# Patient Record
Sex: Female | Born: 1985
Health system: Southern US, Community
[De-identification: ages and names within clinical notes are randomized; demographics above are authoritative.]

## PROBLEM LIST (undated history)

## (undated) DIAGNOSIS — E042 Nontoxic multinodular goiter: Secondary | ICD-10-CM

## (undated) DIAGNOSIS — R079 Chest pain, unspecified: Secondary | ICD-10-CM

## (undated) DIAGNOSIS — I2699 Other pulmonary embolism without acute cor pulmonale: Secondary | ICD-10-CM

## (undated) DIAGNOSIS — K219 Gastro-esophageal reflux disease without esophagitis: Secondary | ICD-10-CM

## (undated) HISTORY — DX: Gastro-esophageal reflux disease without esophagitis: K21.9

## (undated) HISTORY — DX: Other pulmonary embolism without acute cor pulmonale: I26.99

## (undated) HISTORY — DX: Nontoxic multinodular goiter: E04.2

## (undated) HISTORY — PX: WISDOM TOOTH EXTRACTION: SHX21

## (undated) HISTORY — DX: Chest pain, unspecified: R07.9

---
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
## 2007-03-01 DIAGNOSIS — I2699 Other pulmonary embolism without acute cor pulmonale: Secondary | ICD-10-CM

## 2007-03-01 HISTORY — DX: Other pulmonary embolism without acute cor pulmonale: I26.99

## 2007-12-19 ENCOUNTER — Other Ambulatory Visit: Admission: RE | Admit: 2007-12-19 | Discharge: 2007-12-19 | Payer: Self-pay | Admitting: Obstetrics and Gynecology

## 2008-01-01 ENCOUNTER — Encounter: Admission: RE | Admit: 2008-01-01 | Discharge: 2008-01-01 | Payer: Self-pay | Admitting: Obstetrics and Gynecology

## 2008-01-15 ENCOUNTER — Encounter (INDEPENDENT_AMBULATORY_CARE_PROVIDER_SITE_OTHER): Payer: Self-pay | Admitting: Interventional Radiology

## 2008-01-15 ENCOUNTER — Other Ambulatory Visit: Admission: RE | Admit: 2008-01-15 | Discharge: 2008-01-15 | Payer: Self-pay | Admitting: Interventional Radiology

## 2008-01-15 ENCOUNTER — Encounter: Admission: RE | Admit: 2008-01-15 | Discharge: 2008-01-15 | Payer: Self-pay | Admitting: Internal Medicine

## 2008-01-16 ENCOUNTER — Inpatient Hospital Stay (HOSPITAL_COMMUNITY): Admission: EM | Admit: 2008-01-16 | Discharge: 2008-01-18 | Payer: Self-pay | Admitting: Emergency Medicine

## 2008-04-30 ENCOUNTER — Ambulatory Visit: Payer: Self-pay | Admitting: Oncology

## 2008-05-15 LAB — CBC WITH DIFFERENTIAL/PLATELET
BASO%: 0.2 % (ref 0.0–2.0)
Basophils Absolute: 0 10*3/uL (ref 0.0–0.1)
EOS%: 1 % (ref 0.0–7.0)
Eosinophils Absolute: 0.1 10*3/uL (ref 0.0–0.5)
HCT: 42.2 % (ref 34.8–46.6)
HGB: 14.7 g/dL (ref 11.6–15.9)
LYMPH%: 25.6 % (ref 14.0–49.7)
MCH: 32.8 pg (ref 25.1–34.0)
MCHC: 34.9 g/dL (ref 31.5–36.0)
MCV: 93.9 fL (ref 79.5–101.0)
MONO#: 0.4 10*3/uL (ref 0.1–0.9)
MONO%: 5.2 % (ref 0.0–14.0)
NEUT#: 5.8 10*3/uL (ref 1.5–6.5)
NEUT%: 68 % (ref 38.4–76.8)
Platelets: 285 10*3/uL (ref 145–400)
RBC: 4.5 10*6/uL (ref 3.70–5.45)
RDW: 13.7 % (ref 11.2–14.5)
WBC: 8.6 10*3/uL (ref 3.9–10.3)
lymph#: 2.2 10*3/uL (ref 0.9–3.3)

## 2008-05-15 LAB — APTT: aPTT: 38 seconds — ABNORMAL HIGH (ref 24–37)

## 2008-05-15 LAB — PROTHROMBIN TIME
INR: 1.8 — ABNORMAL HIGH (ref 0.0–1.5)
Prothrombin Time: 21.6 seconds — ABNORMAL HIGH (ref 11.6–15.2)

## 2008-05-22 LAB — COMPREHENSIVE METABOLIC PANEL
ALT: 19 U/L (ref 0–35)
AST: 21 U/L (ref 0–37)
Albumin: 4.7 g/dL (ref 3.5–5.2)
Alkaline Phosphatase: 41 U/L (ref 39–117)
BUN: 11 mg/dL (ref 6–23)
CO2: 26 mEq/L (ref 19–32)
Calcium: 9.1 mg/dL (ref 8.4–10.5)
Chloride: 105 mEq/L (ref 96–112)
Creatinine, Ser: 0.97 mg/dL (ref 0.40–1.20)
Glucose, Bld: 96 mg/dL (ref 70–99)
Potassium: 3.9 mEq/L (ref 3.5–5.3)
Sodium: 140 mEq/L (ref 135–145)
Total Bilirubin: 0.5 mg/dL (ref 0.3–1.2)
Total Protein: 7.6 g/dL (ref 6.0–8.3)

## 2008-05-22 LAB — PROTEIN C, TOTAL: Protein C, Total: 50 % — ABNORMAL LOW (ref 70–140)

## 2008-05-22 LAB — BETA-2 GLYCOPROTEIN ANTIBODIES
Beta-2 Glyco I IgG: 5 U/mL (ref <20)
Beta-2-Glycoprotein I IgA: 5 U/mL (ref <10)
Beta-2-Glycoprotein I IgM: <4 U/mL (ref <10)

## 2008-05-22 LAB — PROTEIN S ACTIVITY: Protein S Activity: 34 % — ABNORMAL LOW (ref 69–129)

## 2008-05-22 LAB — CARDIOLIPIN ANTIBODIES, IGG, IGM, IGA
Anticardiolipin IgA: <7 [APL'U] (ref <13)
Anticardiolipin IgG: <7 [GPL'U] (ref <11)
Anticardiolipin IgM: <7 [MPL'U] (ref <10)

## 2008-05-22 LAB — PROTEIN S, TOTAL: Protein S Ag, Total: 75 % (ref 70–140)

## 2008-05-22 LAB — ANTITHROMBIN III: AntiThromb III Func: 107 % (ref 76–126)

## 2008-05-22 LAB — PROTEIN S, ANTIGEN, FREE: Protein S Ag, Free: 44 % normal — ABNORMAL LOW (ref 50–147)

## 2008-08-12 ENCOUNTER — Ambulatory Visit: Payer: Self-pay | Admitting: Oncology

## 2009-02-10 ENCOUNTER — Other Ambulatory Visit: Admission: RE | Admit: 2009-02-10 | Discharge: 2009-02-10 | Payer: Self-pay | Admitting: Obstetrics and Gynecology

## 2010-02-16 ENCOUNTER — Other Ambulatory Visit
Admission: RE | Admit: 2010-02-16 | Discharge: 2010-02-16 | Payer: Self-pay | Source: Home / Self Care | Admitting: Obstetrics and Gynecology

## 2010-07-13 NOTE — H&P (Signed)
NAMEJESSIECA, [name]                ACCOUNT NO.:  [id number]   MEDICAL RECORD NO.:  [id number]          PATIENT TYPE:  EMS   LOCATION:  ED                           FACILITY:  [facility]   PHYSICIAN:  [name], M.D.DATE OF BIRTH:  [birth year]   DATE OF ADMISSION:  01/16/2008  DATE OF DISCHARGE:                              HISTORY & PHYSICAL   PRIMARY CARE PHYSICIAN:  [name], M.D.   CHIEF COMPLAINT:  Right upper quadrant pain.   HISTORY OF PRESENT ILLNESS:  The patient is a 25 year old white female  with no past medical history who approximately 6-7 weeks ago went on a 3  hour car trip to the beach where at that time she did not get out at any  point to stretch her legs and approximately a month ago started on birth  control.  She has otherwise been doing well, but then a few days after  that, she started noticing some right calf pain.  Then on the day prior  to coming in, she started noticing some significant right upper quadrant  pain.  She had noted some warning signs of pneumonia and was concerned  that this was what this might be.  She had no cough or fever, but she  noted that when she took a deep breath, she started having increased  pain in that area.  She came into the emergency room for further  evaluation and treatment.  In the emergency room, she was noted to have  a D-dimer of 0.52 and a white count of 14.9 with an 82% shift.  An  ultrasound of the abdomen was done which was essentially unremarkable  showing no signs of any cholecystitis or related gallbladder issues.  Abdominal x-ray was done which noted subtle airspace opacity in the  right posterior costophrenic sulcus.  It was felt that maybe this was a  posterior basal segment right lower lobe pneumonia with trace associated  pleural effusion.  With the D-dimer coming back elevated, the patient  went for a CT angio of the chest and that is when a small pulmonary  embolus in the right lower lobe was  noted and that is why [facility] were called for admission.  Currently, the patient is doing  okay.  She says it is easier when she sits in a chair versus lying on  her back.  She denies any headaches, vision changes, dysphagia.  No  palpitations.  She only notes some mild shortness of breath and  increased pain when she takes a deep inspiration.  She notes no coughing  or wheezing.  Abdominal pain is more in the chest area.  No hematuria,  dysuria, constipation, diarrhea, focal extremity numbness, weakness or  pain.  She has no pain in her right calf currently.   REVIEW OF SYSTEMS:  Otherwise negative.   PAST MEDICAL HISTORY:  None.   MEDICATIONS:  1. She is on birth control which was started in the last month.  2. Multivitamin daily.   ALLERGIES:  NO KNOWN DRUG ALLERGIES.   SOCIAL HISTORY:  She denies  any tobacco, alcohol or drug use.   FAMILY HISTORY:  Her father is noted to have a history of DVTs which she  has been told that this was secondary to a leg injury years ago,  although the DVTs are more recent.   PHYSICAL EXAMINATION:  VITAL SIGNS:  On admission, temperature 98.8,  heart rate 116 now down to 92, blood pressure 134/62 now down to 117/72,  respirations 20, O2 sat 98% on room air.  GENERAL:  She is alert and  oriented x3 in no apparent distress.  HEENT:  Normocephalic atraumatic.  Mucous membranes are moist.  NECK:  She has no carotid bruits.  HEART:  Regular rate and rhythm, S1-S2.  LUNGS:  She has some slightly decreased inspiratory effort and some  decreased breath sounds at that right base.  ABDOMEN:  Soft, nontender, nondistended.  Positive bowel sounds.  EXTREMITIES:  No clubbing, cyanosis or edema.  Negative for Denna Haggard' sign  bilaterally.   DIAGNOSTICS:  1. Chest x-ray is as per HPI.  2. Abdominal ultrasound is unremarkable.  3. CT angio of the chest as per HPI.   LABORATORY DATA:  The UA is negative.  White count 14.9, H&H 14.6 and  43, MCV  of 96, platelet count 266, 82% neutrophils, sodium 138,  potassium 3.7, chloride 105, bicarb 22, BUN 8, creatinine 0.9, glucose  140.  LFTs are unremarkable.  Lipase is 18, D-dimer 0.52, INR is 1.1.   ASSESSMENT/PLAN:  1. Pulmonary embolus, recent car trip plus/minus oral contraceptives.      We will go with Coumadin and Lovenox plus education.  Will likely      have to discontinue birth control which the patient says that she      would like to do.  2. Leukocytosis, questionable pneumonia versus residual inflammatory      fluid from pulmonary embolus.  We will hold on antibiotics at this      time.  If she spikes a fever or has an increased in leukocytosis by      tomorrow, we will start her on antibiotics as well.  Likely  the      patient will have to go home on Lovenox and Coumadin.      [name], M.D.  Electronically Signed     SKK/MEDQ  D:  01/16/2008  T:  01/16/2008  Job:  [id number]   cc:   Thereasa Solo. Marquez, M.D.  Fax: [phone]

## 2010-08-11 ENCOUNTER — Other Ambulatory Visit: Payer: Self-pay | Admitting: Family Medicine

## 2010-08-11 ENCOUNTER — Ambulatory Visit
Admission: RE | Admit: 2010-08-11 | Discharge: 2010-08-11 | Disposition: A | Discharge status: Home / Self Care | Payer: BC Managed Care – PPO | Source: Ambulatory Visit | Attending: Family Medicine | Admitting: Family Medicine

## 2010-08-11 DIAGNOSIS — E049 Nontoxic goiter, unspecified: Secondary | ICD-10-CM

## 2010-12-01 LAB — CBC
HCT: 37.9
HCT: 42.5
Hemoglobin: 13
Hemoglobin: 14.6
MCHC: 34.2
MCHC: 34.4
MCV: 95.7
MCV: 96.9
Platelets: 231
Platelets: 266
RBC: 3.91
RBC: 4.44
RDW: 13
RDW: 13.1
WBC: 14.9 — ABNORMAL HIGH
WBC: 9.5

## 2010-12-01 LAB — DIFFERENTIAL
Basophils Absolute: 0
Basophils Relative: 0
Eosinophils Absolute: 0
Eosinophils Relative: 0
Lymphocytes Relative: 11 — ABNORMAL LOW
Lymphs Abs: 1.7
Monocytes Absolute: 0.9
Monocytes Relative: 6
Neutro Abs: 12.1 — ABNORMAL HIGH
Neutrophils Relative %: 82 — ABNORMAL HIGH

## 2010-12-01 LAB — PROTIME-INR
INR: 1.1
INR: 1.2
INR: 1.2
Prothrombin Time: 14.4
Prothrombin Time: 15.2
Prothrombin Time: 15.6 — ABNORMAL HIGH

## 2010-12-01 LAB — URINALYSIS, ROUTINE W REFLEX MICROSCOPIC
Bilirubin Urine: NEGATIVE
Glucose, UA: NEGATIVE
Hgb urine dipstick: NEGATIVE
Ketones, ur: NEGATIVE
Nitrite: NEGATIVE
Protein, ur: NEGATIVE
Specific Gravity, Urine: 1.025
Urobilinogen, UA: 0.2
pH: 6

## 2010-12-01 LAB — COMPREHENSIVE METABOLIC PANEL
ALT: 17
AST: 20
Albumin: 3.8
Alkaline Phosphatase: 50
BUN: 8
CO2: 22
Calcium: 9.2
Chloride: 105
Creatinine, Ser: 0.87
GFR calc Af Amer: >60
GFR calc non Af Amer: >60
Glucose, Bld: 140 — ABNORMAL HIGH
Potassium: 3.7
Sodium: 138
Total Bilirubin: 0.6
Total Protein: 7.4

## 2010-12-01 LAB — PREGNANCY, URINE: Preg Test, Ur: NEGATIVE

## 2010-12-01 LAB — D-DIMER, QUANTITATIVE: D-Dimer, Quant: 0.52 — ABNORMAL HIGH

## 2010-12-01 LAB — LIPASE, BLOOD: Lipase: 18

## 2011-03-15 ENCOUNTER — Other Ambulatory Visit: Payer: Self-pay | Admitting: Nurse Practitioner

## 2011-03-15 ENCOUNTER — Other Ambulatory Visit (HOSPITAL_COMMUNITY)
Admission: RE | Admit: 2011-03-15 | Discharge: 2011-03-15 | Disposition: A | Discharge status: Home / Self Care | Payer: BC Managed Care – PPO | Source: Ambulatory Visit | Attending: Obstetrics and Gynecology | Admitting: Obstetrics and Gynecology

## 2011-03-15 DIAGNOSIS — Z01419 Encounter for gynecological examination (general) (routine) without abnormal findings: Secondary | ICD-10-CM | POA: Insufficient documentation

## 2012-03-15 ENCOUNTER — Other Ambulatory Visit: Payer: Self-pay | Admitting: Nurse Practitioner

## 2012-03-15 ENCOUNTER — Other Ambulatory Visit (HOSPITAL_COMMUNITY)
Admission: RE | Admit: 2012-03-15 | Discharge: 2012-03-15 | Disposition: A | Discharge status: Home / Self Care | Payer: 59 | Source: Ambulatory Visit | Attending: Obstetrics and Gynecology | Admitting: Obstetrics and Gynecology

## 2012-03-15 DIAGNOSIS — Z01419 Encounter for gynecological examination (general) (routine) without abnormal findings: Secondary | ICD-10-CM | POA: Insufficient documentation

## 2013-03-14 ENCOUNTER — Other Ambulatory Visit: Payer: Self-pay | Admitting: Nurse Practitioner

## 2013-03-14 ENCOUNTER — Other Ambulatory Visit (HOSPITAL_COMMUNITY)
Admission: RE | Admit: 2013-03-14 | Discharge: 2013-03-14 | Disposition: A | Discharge status: Home / Self Care | Payer: 59 | Source: Ambulatory Visit | Attending: Obstetrics and Gynecology | Admitting: Obstetrics and Gynecology

## 2013-03-14 DIAGNOSIS — Z01419 Encounter for gynecological examination (general) (routine) without abnormal findings: Secondary | ICD-10-CM | POA: Insufficient documentation

## 2013-04-11 ENCOUNTER — Other Ambulatory Visit: Payer: Self-pay | Admitting: Family Medicine

## 2013-04-11 DIAGNOSIS — E041 Nontoxic single thyroid nodule: Secondary | ICD-10-CM

## 2013-04-17 ENCOUNTER — Ambulatory Visit
Admission: RE | Admit: 2013-04-17 | Discharge: 2013-04-17 | Disposition: A | Discharge status: Home / Self Care | Payer: 59 | Source: Ambulatory Visit | Attending: Family Medicine | Admitting: Family Medicine

## 2013-04-17 DIAGNOSIS — E041 Nontoxic single thyroid nodule: Secondary | ICD-10-CM

## 2013-04-22 ENCOUNTER — Encounter: Payer: Self-pay | Admitting: General Surgery

## 2013-04-22 DIAGNOSIS — R002 Palpitations: Secondary | ICD-10-CM

## 2013-04-23 ENCOUNTER — Ambulatory Visit: Payer: 59 | Admitting: Cardiology

## 2013-05-08 ENCOUNTER — Encounter: Payer: Self-pay | Admitting: Cardiology

## 2013-05-22 ENCOUNTER — Ambulatory Visit (INDEPENDENT_AMBULATORY_CARE_PROVIDER_SITE_OTHER): Payer: 59 | Admitting: Cardiology

## 2013-05-22 ENCOUNTER — Encounter: Payer: Self-pay | Admitting: Cardiology

## 2013-05-22 VITALS — BP 118/70 | HR 90 | Ht 65.0 in | Wt 187.0 lb

## 2013-05-22 DIAGNOSIS — R002 Palpitations: Secondary | ICD-10-CM

## 2013-05-22 DIAGNOSIS — R0602 Shortness of breath: Secondary | ICD-10-CM

## 2013-05-22 NOTE — Progress Notes (Signed)
  [place], [address], [place]  [address] Phone: [phone] Fax:  [phone]  Date:  05/22/2013   ID:  [name], DOB 1985-12-27, MRN [id number]  PCP:  Gennette Pac, MD  Cardiologist:  Fransico Him, MD     History of Present Illness: [name] is a 28 y.o. female with no prior cardiac history who presents today for evaluation of palpitations.  She has had them for years but they have become more frequent.  She notices it right after exercise but has not been exercising recently because it scares her when she gets it.  She will lay down on the couch and cough and it resolve.  The longest episode she has ever had was 6 minutes.  She has been awakened by them before.  She occasionally has some SOB but denies any chest pain.  She denies any LE edema or syncope but has had dizziness with the palpitations.   Wt Readings from Last 3 Encounters:  05/22/13 187 lb (84.823 kg)     Past Medical History  Diagnosis Date  . PE (pulmonary embolism) 2009    Unknown source, possibly due to contraception,car trip Dr Tessa Lerner labs all neg  . Multinodular goiter     2 nodules bx previously,seen by endo in the past U/S 6/12, 2 right thyroid nodules are stable, unchanged from prior    Current Outpatient Prescriptions  Medication Sig Dispense Refill  . Biotin 1000 MCG tablet Take 1,000 mcg by mouth daily.      . Levonorgestrel (SKYLA) 13.5 MG IUD 13.5 mg by Intrauterine route. Replace every 3 years      . Multiple Vitamins-Calcium (ONE-A-DAY WOMENS FORMULA PO) Take 1 tablet by mouth daily.      Marland Kitchen omeprazole (PRILOSEC OTC) 20 MG tablet Take 20 mg by mouth as needed.       No current facility-administered medications for this visit.    Allergies:   No Known Allergies  Social History:  The patient  reports that she has never smoked. She does not have any smokeless tobacco history on file. She reports that she drinks alcohol. She reports that she does not use  illicit drugs.   Family History:  The patient's family history includes Colon cancer in her maternal grandmother and mother; Deep vein thrombosis in her father; Diabetes in her father and paternal grandmother; Hypertension in her father and mother; Ovarian cancer in her maternal grandmother.   ROS:  Please see the history of present illness.      All other systems reviewed and negative.   PHYSICAL EXAM: VS:  BP 118/70  Pulse 90  Ht 5\' 5"  (1.651 m)  Wt 187 lb (84.823 kg)  BMI 31.12 kg/m2 Well nourished, well developed, in no acute distress HEENT: normal Neck: no JVD Cardiac:  normal S1, S2; RRR; no murmur Lungs:  clear to auscultation bilaterally, no wheezing, rhonchi or rales Abd: soft, nontender, no hepatomegaly    EKG:  NSR with no ST changes and normal QTc 04/10/2013   ASSESSMENT AND PLAN: 1. Palpitations - Her symptoms sound like SVT since she can stop them when she coughs - event monitor to assess for arrhythmias 2. SOB - 2D echo to assess LVF   Followup with me in 4 weeks  Signed, Fransico Him, MD 05/22/2013 1:42 PM

## 2013-05-22 NOTE — Patient Instructions (Signed)
Your physician recommends that you continue on your current medications as directed. Please refer to the Current Medication list given to you today.  Your physician has requested that you have an echocardiogram. Echocardiography is a painless test that uses sound waves to create images of your heart. It provides your doctor with information about the size and shape of your heart and how well your heart's chambers and valves are working. This procedure takes approximately one hour. There are no restrictions for this procedure.  Your physician has recommended that you wear an event monitor. Event monitors are medical devices that record the heart's electrical activity. Doctors most often [place] these monitors to diagnose arrhythmias. Arrhythmias are problems with the speed or rhythm of the heartbeat. The monitor is a small, portable device. You can wear one while you do your normal daily activities. This is usually used to diagnose what is causing palpitations/syncope (passing out). ( schedule for after pts vacation next week)  Your physician recommends that you schedule a follow-up appointment in: 6 weeks with Dr Radford Pax

## 2013-05-27 ENCOUNTER — Telehealth: Payer: Self-pay | Admitting: Cardiology

## 2013-05-27 NOTE — Telephone Encounter (Signed)
Forward to Justin for pts question about monitor

## 2013-05-27 NOTE — Telephone Encounter (Signed)
New Prob    Pt has some questions regarding picking up her heart monitor. Please call.

## 2013-05-28 ENCOUNTER — Encounter: Payer: Self-pay | Admitting: *Deleted

## 2013-05-28 ENCOUNTER — Encounter (INDEPENDENT_AMBULATORY_CARE_PROVIDER_SITE_OTHER): Payer: 59

## 2013-05-28 DIAGNOSIS — R002 Palpitations: Secondary | ICD-10-CM

## 2013-05-28 NOTE — Progress Notes (Signed)
Patient ID: [name], female   DOB: 1985-03-14, 28 y.o.   MRN: [id number] Patient sent home with a [employer] 30 day cardiac event monitor.  Patient instructed how to start and use monitor starting  06/06/2013, after returning from the [place].

## 2013-06-07 ENCOUNTER — Ambulatory Visit (HOSPITAL_COMMUNITY): Payer: 59 | Attending: Cardiology | Admitting: Cardiology

## 2013-06-07 DIAGNOSIS — R002 Palpitations: Secondary | ICD-10-CM | POA: Insufficient documentation

## 2013-06-07 DIAGNOSIS — R0602 Shortness of breath: Secondary | ICD-10-CM | POA: Insufficient documentation

## 2013-06-07 NOTE — Progress Notes (Signed)
Echo performed. 

## 2013-06-11 ENCOUNTER — Ambulatory Visit: Payer: 59 | Admitting: Cardiology

## 2013-06-13 ENCOUNTER — Ambulatory Visit: Payer: 59 | Admitting: Cardiology

## 2013-06-25 ENCOUNTER — Other Ambulatory Visit: Payer: Self-pay | Admitting: General Surgery

## 2013-06-25 DIAGNOSIS — R931 Abnormal findings on diagnostic imaging of heart and coronary circulation: Secondary | ICD-10-CM

## 2013-06-25 DIAGNOSIS — R0602 Shortness of breath: Secondary | ICD-10-CM

## 2013-07-02 ENCOUNTER — Ambulatory Visit (INDEPENDENT_AMBULATORY_CARE_PROVIDER_SITE_OTHER)
Admission: RE | Admit: 2013-07-02 | Discharge: 2013-07-02 | Disposition: A | Discharge status: Home / Self Care | Payer: 59 | Source: Ambulatory Visit | Attending: Cardiology | Admitting: Cardiology

## 2013-07-02 DIAGNOSIS — R0602 Shortness of breath: Secondary | ICD-10-CM

## 2013-07-02 DIAGNOSIS — R931 Abnormal findings on diagnostic imaging of heart and coronary circulation: Secondary | ICD-10-CM

## 2013-07-02 DIAGNOSIS — R9389 Abnormal findings on diagnostic imaging of other specified body structures: Secondary | ICD-10-CM

## 2013-07-02 MED ORDER — IOHEXOL 350 MG/ML SOLN
100.0000 mL | Freq: Once | INTRAVENOUS | Status: AC | PRN
  Administered 2013-07-02: 100 mL via INTRAVENOUS

## 2013-07-08 ENCOUNTER — Encounter: Payer: Self-pay | Admitting: Cardiology

## 2013-07-08 ENCOUNTER — Ambulatory Visit (INDEPENDENT_AMBULATORY_CARE_PROVIDER_SITE_OTHER): Payer: 59 | Admitting: Cardiology

## 2013-07-08 VITALS — BP 104/78 | HR 96 | Ht 65.0 in | Wt 188.0 lb

## 2013-07-08 DIAGNOSIS — R931 Abnormal findings on diagnostic imaging of heart and coronary circulation: Secondary | ICD-10-CM

## 2013-07-08 DIAGNOSIS — R9389 Abnormal findings on diagnostic imaging of other specified body structures: Secondary | ICD-10-CM

## 2013-07-08 DIAGNOSIS — R0602 Shortness of breath: Secondary | ICD-10-CM

## 2013-07-08 DIAGNOSIS — R002 Palpitations: Secondary | ICD-10-CM

## 2013-07-08 NOTE — Patient Instructions (Signed)
Your physician recommends that you continue on your current medications as directed. Please refer to the Current Medication list given to you today.  Your physician has requested that you have a cardiac MRI. Cardiac MRI uses a computer to create images of your heart as its beating, producing both still and moving pictures of your heart and major blood vessels. For further information please visit [email or web address] Please follow the instruction sheet given to you today for more information. ( Schedule at [facility])  Your physician recommends that you schedule a follow-up appointment: As Needed pending results

## 2013-07-08 NOTE — Progress Notes (Signed)
  [place], [address], [place]  [address] Phone: [phone] Fax:  [phone]  Date:  07/08/2013   ID:  [name], DOB [birth year], MRN [id number]  PCP:  Gennette Pac, MD  Cardiologist:  Fransico Him, MD   History of Present Illness: [name] is a 28 y.o. female with no prior cardiac history who presented recently for evaluation of palpitations. She has had them for years but they have become more frequent. She was also complaining of SOB which occurs when she feels the palpitations coming on but also with exertional activities.  A 2D echo showed normal LVF with a septation in the LA.  A chest CT could not evaluate it adequately and I recommended a Cardiac MRI.     Wt Readings from Last 3 Encounters:  07/08/13 188 lb (85.276 kg)  05/22/13 187 lb (84.823 kg)     Past Medical History  Diagnosis Date  . PE (pulmonary embolism) 2009    Unknown source, possibly due to contraception,car trip Dr Tessa Lerner labs all neg  . Multinodular goiter     2 nodules bx previously,seen by endo in the past U/S 6/12, 2 right thyroid nodules are stable, unchanged from prior    Current Outpatient Prescriptions  Medication Sig Dispense Refill  . Biotin 1000 MCG tablet Take 1,000 mcg by mouth daily.      . Levonorgestrel (SKYLA) 13.5 MG IUD 13.5 mg by Intrauterine route. Replace every 3 years      . Multiple Vitamins-Calcium (ONE-A-DAY WOMENS FORMULA PO) Take 1 tablet by mouth daily.      Marland Kitchen omeprazole (PRILOSEC OTC) 20 MG tablet Take 20 mg by mouth as needed.       No current facility-administered medications for this visit.    Allergies:   No Known Allergies  Social History:  The patient  reports that she has never smoked. She does not have any smokeless tobacco history on file. She reports that she drinks alcohol. She reports that she does not use illicit drugs.   Family History:  The patient's family history includes Colon cancer in her maternal grandmother and  mother; Deep vein thrombosis in her father; Diabetes in her father and paternal grandmother; Hypertension in her father and mother; Ovarian cancer in her maternal grandmother.   ROS:  Please see the history of present illness.      All other systems reviewed and negative.   PHYSICAL EXAM: VS:  BP 104/78  Pulse 96  Ht 5\' 5"  (1.651 m)  Wt 188 lb (85.276 kg)  BMI 31.28 kg/m2      ASSESSMENT AND PLAN:  1. Palpitations- she has been wearing a heart monitor and turned it in on Friday - report no back yet 2. SOB with normal LVF.  If Cardiac MRI is normal will set up for ETT and if no ischemia then refer to Pulmonary - she says that her SOB started after having bronchitis last year 3. Septation in the LA if unknown significance  - I will get a Cardiac MRI with contrast to evaluate  Followup with me pending results of studies  Signed, Fransico Him, MD 07/08/2013 4:21 PM

## 2013-07-09 ENCOUNTER — Telehealth: Payer: Self-pay | Admitting: Cardiology

## 2013-07-09 NOTE — Telephone Encounter (Signed)
Follow Up:  Pt states she is returning a nurses call

## 2013-07-09 NOTE — Telephone Encounter (Signed)
Please let patient know that heart monitor showed normal rhythm with occasional PAC's and PVC's which are benign. Some of the PVC's and PAC's correspond to her complaints of palpitations but at other times her monitor was normal during the palpitations.  We could try to use a medicine to suppress the palpitations but some of the meds have side effects worse than the palpitations which are benign.  ALso if she got pregnant she would have to come off of the medication.

## 2013-07-09 NOTE — Telephone Encounter (Signed)
lmtcb

## 2013-07-09 NOTE — Telephone Encounter (Signed)
Follow Up:  Pt is returning a call to [facility]

## 2013-07-09 NOTE — Telephone Encounter (Signed)
Discussed Dr. Theodosia Blender report of heart monitor results.  Patient does not want to take any medication at this time.  She will try to reduce stress, eliminate caffeine and see if this makes a difference.

## 2013-07-15 ENCOUNTER — Other Ambulatory Visit: Payer: Self-pay | Admitting: General Surgery

## 2013-07-15 ENCOUNTER — Telehealth: Payer: Self-pay | Admitting: Cardiology

## 2013-07-15 DIAGNOSIS — Z79899 Other long term (current) drug therapy: Secondary | ICD-10-CM

## 2013-07-15 NOTE — Telephone Encounter (Signed)
New Message:  Pt is requesting a call back to find out more info about her MRI. Pt also wants to know about her blood work.Marland Kitchen appt notes for her MRI state pt needs blood work prior. However, there are no orders. Pt is requesting a call back from then nurse

## 2013-07-15 NOTE — Telephone Encounter (Signed)
Lab for BMET put in for pt. [name] will be calling pt to schedule.

## 2013-07-15 NOTE — Telephone Encounter (Signed)
Unsure what lab work would be required for pt. [name] is in a meeting currently. Waiting for her to get out to speak with her about the need for lab work prior to procedure.

## 2013-07-16 NOTE — Telephone Encounter (Signed)
Pt has been scheduled. Sheliah Mends is calling pt today to make aware.

## 2013-07-19 ENCOUNTER — Encounter: Payer: Self-pay | Admitting: Cardiology

## 2013-07-25 ENCOUNTER — Ambulatory Visit (HOSPITAL_COMMUNITY)
Admission: RE | Admit: 2013-07-25 | Discharge: 2013-07-25 | Disposition: A | Discharge status: Home / Self Care | Payer: 59 | Source: Ambulatory Visit | Attending: Cardiology | Admitting: Cardiology

## 2013-07-25 ENCOUNTER — Other Ambulatory Visit: Payer: Self-pay | Admitting: Cardiology

## 2013-07-25 DIAGNOSIS — I498 Other specified cardiac arrhythmias: Secondary | ICD-10-CM | POA: Insufficient documentation

## 2013-07-25 DIAGNOSIS — R931 Abnormal findings on diagnostic imaging of heart and coronary circulation: Secondary | ICD-10-CM

## 2013-07-25 DIAGNOSIS — R9389 Abnormal findings on diagnostic imaging of other specified body structures: Secondary | ICD-10-CM

## 2013-07-25 LAB — CREATININE, SERUM
Creatinine, Ser: 0.82 mg/dL (ref 0.50–1.10)
GFR calc Af Amer: >90 mL/min (ref >90)
GFR calc non Af Amer: >90 mL/min (ref >90)

## 2013-07-25 MED ORDER — GADOBENATE DIMEGLUMINE 529 MG/ML IV SOLN
18.0000 mL | Freq: Once | INTRAVENOUS | Status: AC | PRN
  Administered 2013-07-25: 18 mL via INTRAVENOUS

## 2013-07-26 ENCOUNTER — Telehealth: Payer: Self-pay | Admitting: Cardiology

## 2013-07-26 ENCOUNTER — Telehealth: Payer: Self-pay | Admitting: *Deleted

## 2013-07-26 DIAGNOSIS — R0602 Shortness of breath: Secondary | ICD-10-CM

## 2013-07-26 NOTE — Telephone Encounter (Signed)
Message copied by Nuala Alpha on Fri Jul 26, 2013  1:25 PM ------      Message from: Fransico Him R      Created: Thu Jul 25, 2013  9:40 PM       Please set up for ETT to evaluate for source of SOB ------

## 2013-07-26 NOTE — Telephone Encounter (Signed)
Patient has a couple more

## 2013-07-26 NOTE — Telephone Encounter (Signed)
Per Dr Radford Pax this pt needs to be set up for a gxt for source of sob.     Pt notified of this. Informed her that someone from our scheduling dept will be calling her soon to have this test scheduled.    Pt verbalized understanding and agrees with this treatment plan.

## 2013-07-30 ENCOUNTER — Encounter: Payer: Self-pay | Admitting: *Deleted

## 2013-07-30 ENCOUNTER — Telehealth: Payer: Self-pay | Admitting: *Deleted

## 2013-07-30 NOTE — Telephone Encounter (Signed)
New message   Have couple of questions for the nurse before she make an appt for exercise tolerance test.

## 2013-07-30 NOTE — Telephone Encounter (Signed)
LMTCB 6/2

## 2013-07-30 NOTE — Telephone Encounter (Signed)
Patient called in and stated that she wanted to speak to Dr. Radford Pax prior to scheduling ETT. She states she is very anxious and has questions related to her possible diagnosis. She expressed that she looked up information online and is now very very nervous and concerned. Nurse provided very generalized information to share with her that treatment options are dependent on symptoms and severity of status.  Provided reassurance and indicated that I would share her request with Dr. Radford Pax.

## 2013-07-30 NOTE — Telephone Encounter (Signed)
Message copied by Nuala Alpha on Tue Jul 30, 2013 11:52 AM ------      Message from: Sueanne Margarita      Created: Tue Jul 30, 2013 11:41 AM      Regarding: RE: cancelled gxt       Please send her a certified letter to call the office to set up her ETT      ----- Message -----         From: Nuala Alpha, LPN         Sent: 10/06/3732   9:20 AM           To: Sueanne Margarita, MD      Subject: cancelled gxt                                            Dr Radford Pax scheduling came to me today about this pt and trying to get her scheduled for a gxt as ordered by you. This pt will not answer her phone after several attempts per the schedulers. [facility] schedulers state they are taking her off the desktop. They wanted me to let you know this.             Thanks ,             Ajee Heasley        ------

## 2013-07-30 NOTE — Telephone Encounter (Signed)
LMTCB

## 2013-07-30 NOTE — Telephone Encounter (Signed)
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  Recommend proceeding with ETT and patient agrees

## 2013-07-30 NOTE — Telephone Encounter (Signed)
Will further inform this pt through a letter to call the office to schedule ETT, per Dr Radford Pax.

## 2013-07-30 NOTE — Telephone Encounter (Signed)
Routed to [facility] so patient can be scheduled for ETT.

## 2013-09-10 ENCOUNTER — Encounter: Payer: 59 | Admitting: Nurse Practitioner

## 2013-09-17 ENCOUNTER — Encounter: Payer: 59 | Admitting: Nurse Practitioner

## 2013-09-27 ENCOUNTER — Encounter: Payer: 59 | Admitting: Physician Assistant

## 2014-01-27 ENCOUNTER — Telehealth: Payer: Self-pay | Admitting: Cardiology

## 2014-01-27 NOTE — Telephone Encounter (Signed)
F/u    Pt calling concerning previous message.

## 2014-01-27 NOTE — Telephone Encounter (Signed)
Pt st she had a visit with a [facility] pediatric cardiology specialist who wants her to have a cardio-pulmonary fit test instead of GXT. Patient wants to know if Dr. Radford Pax will order this instead.  To Dr. Radford Pax for review and recommendations.

## 2014-01-27 NOTE — Telephone Encounter (Signed)
New Message  Pt called request a call back to order a Cardio Pulminary stress test. Scheduler was advised per nurse that the order on file for a GXT was not the same test. Please put in orders if it is OK for this PT to have this test//sr

## 2014-01-28 NOTE — Telephone Encounter (Signed)
Left message to call back  

## 2014-01-28 NOTE — Telephone Encounter (Signed)
Pt saw Dr. Beacher May (on second floor) yesterday (11/30) before she called our office.

## 2014-01-28 NOTE — Telephone Encounter (Signed)
Please find out when the patient saw the Pediatric Cardiologist

## 2014-01-28 NOTE — Telephone Encounter (Signed)
Who referred her to [facility] - I would prefer she only see one Cardiologist - either myself or [facility]

## 2014-01-28 NOTE — Telephone Encounter (Signed)
Follow up  Pt returning phone call; please call back

## 2014-01-29 NOTE — Telephone Encounter (Signed)
Left message to call back  

## 2014-01-30 NOTE — Telephone Encounter (Signed)
Follow Up ° ° ° ° ° ° ° °Pt returning phone call °

## 2014-01-31 NOTE — Telephone Encounter (Signed)
Left message on patient's private VM that Dr. Radford Pax would like for her to only see one cardiologist. Explained to her it is better for care and so the doctors aren't "stepping on each others' toes."  Instructed patient to think about it and call our office with her decision.

## 2014-02-05 NOTE — Telephone Encounter (Signed)
Follow up ° °Pt returned call  °

## 2014-02-05 NOTE — Telephone Encounter (Signed)
Pt st she wants to keep her ETT scheduled for tomorrow. Patient understands that by keeping this appointment, she is agreeing to take Dr. Radford Pax as her primary cardiologist and she will treat her after the stress test.

## 2014-02-05 NOTE — Telephone Encounter (Signed)
Patient st to call her at 56.

## 2014-02-05 NOTE — Telephone Encounter (Signed)
Left message to call back. Patient scheduled for ETT and never informed us whether she will be a patient of Dr. Theodosia Blender or of Dr. Jonna Clark -- need clarification.

## 2014-02-06 ENCOUNTER — Ambulatory Visit (INDEPENDENT_AMBULATORY_CARE_PROVIDER_SITE_OTHER): Payer: 59 | Admitting: Physician Assistant

## 2014-02-06 DIAGNOSIS — R0602 Shortness of breath: Secondary | ICD-10-CM

## 2014-02-06 NOTE — Progress Notes (Signed)
Exercise Treadmill Test  Pre-Exercise Testing Evaluation Rhythm: normal sinus  Rate: 85 bpm     Test  Exercise Tolerance Test Ordering MD: Fransico Him, MD  Interpreting MD: Richardson Dopp, PA-C  Unique Test No: 1  Treadmill:  1  Indication for ETT: exertional dyspnea  Contraindication to ETT: No   Stress Modality: exercise - treadmill  Cardiac Imaging Performed: non   Protocol: standard Bruce - maximal  Max BP:  165/71  Max MPHR (bpm):  194 85% MPR (bpm):  165  MPHR obtained (bpm):  173 % MPHR obtained:  89  Reached 85% MPHR (min:sec):  6:58 Total Exercise Time (min-sec):  9:00  Workload in METS:  10.1 Borg Scale: 14  Reason ETT Terminated:  desired heart rate attained    ST Segment Analysis At Rest: normal ST segments - no evidence of significant ST depression With Exercise: no evidence of significant ST depression  Other Information Arrhythmia:  No Angina during ETT:  present (1) Quality of ETT:  diagnostic  ETT Interpretation:  normal - no evidence of ischemia by ST analysis  Comments: Good exercise capacity. Patient complained of chest pain prior to exercise and during (tightness). Normal BP response to exercise. No ST changes to suggest ischemia.  Clinically + Electrically negative. Lung exam benign post exercise.  Recommendations: FU with Dr. Fransico Him as directed. Signed,  Richardson Dopp, PA-C   02/06/2014 11:01 AM

## 2014-02-11 ENCOUNTER — Telehealth: Payer: Self-pay | Admitting: Cardiology

## 2014-02-11 DIAGNOSIS — R0602 Shortness of breath: Secondary | ICD-10-CM

## 2014-02-11 DIAGNOSIS — R079 Chest pain, unspecified: Secondary | ICD-10-CM

## 2014-02-11 NOTE — Telephone Encounter (Signed)
New Message         Pt calling for exc tolerance test results. Please call back and advise.

## 2014-02-12 NOTE — Telephone Encounter (Signed)
Patient informed of normal GXT results and verbal understanding expressed.   Pt c/o more frequent stabbing chest pain 7-8/10. She st it occasionally feels like someone is sitting on top of her chest.  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 She st she is still having flutters palpitations.  To Dr. Radford Pax for recommendations.

## 2014-02-14 NOTE — Telephone Encounter (Signed)
In regards to the palpitations, she had PAC's and PVC's on her monitor that are benign.  I offered her medication back in May but never heard anything back.  Her CP is very atypical and she had a normal ETT.  I reviewed the findings of her cardiac MRI with Dr. Meda Coffee.  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  She recommends that we get a stress echo and assess PA pressures during stress and at rest.  Please set this up with Lenard Forth.

## 2014-02-17 DIAGNOSIS — R079 Chest pain, unspecified: Secondary | ICD-10-CM | POA: Insufficient documentation

## 2014-02-17 NOTE — Telephone Encounter (Signed)
Patient informed of Dr. Theodosia Blender recommendations and verbal understanding expressed.  Stress ECHO ordered for scheduling.

## 2014-02-27 ENCOUNTER — Ambulatory Visit (HOSPITAL_COMMUNITY): Payer: 59

## 2014-02-27 ENCOUNTER — Telehealth: Payer: Self-pay

## 2014-02-27 ENCOUNTER — Ambulatory Visit (HOSPITAL_COMMUNITY): Payer: 59 | Attending: Cardiology | Admitting: Radiology

## 2014-02-27 DIAGNOSIS — R079 Chest pain, unspecified: Secondary | ICD-10-CM

## 2014-02-27 DIAGNOSIS — R931 Abnormal findings on diagnostic imaging of heart and coronary circulation: Secondary | ICD-10-CM

## 2014-02-27 DIAGNOSIS — R0602 Shortness of breath: Secondary | ICD-10-CM | POA: Diagnosis not present

## 2014-02-27 NOTE — Progress Notes (Signed)
Stress Echocardiogram performed.  

## 2014-02-27 NOTE — Telephone Encounter (Signed)
Patient informed of results and verbal understanding expressed.  Pulmonary referral placed for scheduling.  

## 2014-02-27 NOTE — Telephone Encounter (Signed)
-----   Message from Sueanne Margarita, MD sent at 02/27/2014  4:31 PM EST ----- Please let patient know that echo showed no ischemia.  There was no increase in PA pressures or in MV velocity with peak exercise.  No cardiac etiology of patient's SOB. Please refer patient to pulmonary for evaluation

## 2014-03-05 ENCOUNTER — Institutional Professional Consult (permissible substitution): Payer: 59 | Admitting: Pulmonary Disease

## 2014-03-21 ENCOUNTER — Institutional Professional Consult (permissible substitution): Payer: 59 | Admitting: Pulmonary Disease

## 2014-04-21 ENCOUNTER — Ambulatory Visit (INDEPENDENT_AMBULATORY_CARE_PROVIDER_SITE_OTHER): Payer: 59 | Admitting: Internal Medicine

## 2014-04-21 ENCOUNTER — Encounter: Payer: Self-pay | Admitting: Internal Medicine

## 2014-04-21 ENCOUNTER — Encounter (INDEPENDENT_AMBULATORY_CARE_PROVIDER_SITE_OTHER): Payer: Self-pay

## 2014-04-21 VITALS — BP 110/78 | HR 104 | Ht 64.0 in | Wt 205.0 lb

## 2014-04-21 DIAGNOSIS — R0600 Dyspnea, unspecified: Secondary | ICD-10-CM

## 2014-04-21 DIAGNOSIS — R0689 Other abnormalities of breathing: Secondary | ICD-10-CM | POA: Insufficient documentation

## 2014-04-21 NOTE — Progress Notes (Signed)
Subjective:    Patient ID: [name], female    DOB: 06/14/1985, 29 y.o.   MRN: [id number]  HPI   OV 04/21/2014  Chief Complaint  Patient presents with  . Pulmonary Consult    Pt referred by Dr. Radford Pax for SOB and CP with and without activity.     29 year old female referred for dyspnea  Reports that back in 2009 she was taking oral contraceptive pills and in the middle of that treatment developed acute shortness of breath and was diagnosed her pulmonary embolism. She recollects warfarin treatment for 9 months or 8 months. Since then she has been completely well. In the winter of 2015 her mom was diagnosed to have colon cancer and is undergoing chemotherapy. She says that she was socially stressed at this time. Then sometime after this possibly in the spring of 2015 started noticing insidious onset of shortness of breath. This moderate in severity and has persisted. She reports episodic dyspnea. Happens randomly especially at rest. There is a sensation that she has to take a deep breath. Last several minutes and then spontaneously resolves. There might be some radiating pain but she's not sure. There is no relationship with exertion. This no associated cough or wheezing except in the last month after course she's had some cough.  She underwent CT scanning imaging in May 2015 that was negative for pulmonary embolism. She did have some patulous gastroesophageal junction.  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     has a past medical history of PE (pulmonary embolism) (2009); Multinodular goiter; Acid reflux; and Chest pain.   reports that she has never smoked. She has never used smokeless tobacco.  Past Surgical History  Procedure Laterality Date  . Wisdom tooth extraction      No Known Allergies  Immunization History    Administered Date(s) Administered  . Influenza Split 11/28/2013    Family History  Problem Relation Age of Onset  . Hypertension Mother   . Colon cancer Mother   . Deep vein thrombosis Father   . Diabetes Father   . Hypertension Father   . Colon cancer Maternal Grandmother   . Ovarian cancer Maternal Grandmother   . Diabetes Paternal Grandmother      Current outpatient prescriptions:  .  Biotin 1000 MCG tablet, Take 1,000 mcg by mouth daily., Disp: , Rfl:  .  Cholecalciferol (VITAMIN D3) 3000 UNITS TABS, Take 1,000 Units by mouth daily., Disp: , Rfl:  .  Levonorgestrel (SKYLA) 13.5 MG IUD, 13.5 mg by Intrauterine route. Replace every 3 years, Disp: , Rfl:  .  Multiple Vitamins-Calcium (ONE-A-DAY WOMENS FORMULA PO), Take 1 tablet by mouth daily., Disp: , Rfl:  .  omeprazole (PRILOSEC OTC) 20 MG tablet, Take 20 mg by mouth as needed., Disp: , Rfl:       Review of Systems  Constitutional: Negative for fever and unexpected weight change.  HENT: Negative for congestion, dental problem, ear pain, nosebleeds, postnasal drip, rhinorrhea, sinus pressure, sneezing, sore throat and trouble swallowing.   Eyes: Negative for redness and itching.  Respiratory: Positive for cough and shortness of breath. Negative for chest tightness and wheezing.   Cardiovascular: Positive for chest pain and palpitations. Negative for leg swelling.  Gastrointestinal: Negative for nausea and vomiting.  Genitourinary: Negative for dysuria.  Musculoskeletal: Negative for joint swelling.  Skin: Negative for  rash.  Neurological: Negative for headaches.  Hematological: Does not bruise/bleed easily.  Psychiatric/Behavioral: Negative for dysphoric mood. The patient is not nervous/anxious.        Objective:   Physical Exam  Constitutional: She is oriented to person, place, and time. She appears well-developed and well-nourished. No distress.  HENT:  Head: Normocephalic and atraumatic.  Right Ear: External  ear normal.  Left Ear: External ear normal.  Mouth/Throat: Oropharynx is clear and moist. No oropharyngeal exudate.  Eyes: Conjunctivae and EOM are normal. Pupils are equal, round, and reactive to light. Right eye exhibits no discharge. Left eye exhibits no discharge. No scleral icterus.  Neck: Normal range of motion. Neck supple. No JVD present. No tracheal deviation present. No thyromegaly present.  Cardiovascular: Normal rate, regular rhythm, normal heart sounds and intact distal pulses.  Exam reveals no gallop and no friction rub.   No murmur heard. Pulmonary/Chest: Effort normal and breath sounds normal. No respiratory distress. She has no wheezes. She has no rales. She exhibits no tenderness.  Abdominal: Soft. Bowel sounds are normal. She exhibits no distension and no mass. There is no tenderness. There is no rebound and no guarding.  Musculoskeletal: Normal range of motion. She exhibits no edema or tenderness.  Lymphadenopathy:    She has no cervical adenopathy.  Neurological: She is alert and oriented to person, place, and time. She has normal reflexes. No cranial nerve deficit. She exhibits normal muscle tone. Coordination normal.  Skin: Skin is warm and dry. No rash noted. She is not diaphoretic. No erythema. No pallor.  Psychiatric: She has a normal mood and affect. Her behavior is normal. Judgment and thought content normal.  Vitals reviewed.  Filed Vitals:   04/21/14 1608  BP: 110/78  Pulse: 104  Height: 5\' 4"  (1.626 m)  Weight: 205 lb (92.987 kg)  SpO2: 97%    Estimated body mass index is 35.17 kg/(m^2) as calculated from the following:   Height as of this encounter: 5\' 4"  (1.626 m).   Weight as of this encounter: 205 lb (92.987 kg).      Assessment & Plan:     ICD-9-CM ICD-10-CM   1. Dyspnea and respiratory abnormality 786.09 R06.00 Pulmonary Function Test    R06.89     unclear cause of dyspnea. ?> GERD with patulous GE jn, ? Asthma, fitness issues, review of  cardiology notes and per patient does not sound cardiac  Plan Methacholine challenge test for asthma If negaitve, get CPST bike test If al normal, consider GE reflux as cause  Dr. Brand Males, M.D., [facility].C.P Pulmonary and Critical Care Medicine Staff Physician [facility] Pager: [phone], If no answer or between  15:00h - 7:00h: call [phone]  04/21/2014 4:45 PM

## 2014-04-21 NOTE — Patient Instructions (Signed)
ICD-9-CM ICD-10-CM   1. Dyspnea and respiratory abnormality 786.09 R06.00     R06.89     Do methacholine challenge test for asthma YOu cannot be pregnant for this test Once test complete call us [phone] for review  - if test has asthma will have you come in for visit  - if test negative, will do pulmonary bike stress test  Followup  depending on test results

## 2014-04-30 ENCOUNTER — Encounter (HOSPITAL_COMMUNITY): Payer: 59

## 2014-08-22 ENCOUNTER — Encounter (HOSPITAL_COMMUNITY): Payer: Self-pay

## 2014-08-22 ENCOUNTER — Emergency Department (HOSPITAL_COMMUNITY): Payer: PRIVATE HEALTH INSURANCE

## 2014-08-22 ENCOUNTER — Emergency Department (HOSPITAL_COMMUNITY)
Admission: EM | Admit: 2014-08-22 | Discharge: 2014-08-22 | Disposition: A | Discharge status: Home / Self Care | Payer: PRIVATE HEALTH INSURANCE | Attending: Emergency Medicine | Admitting: Emergency Medicine

## 2014-08-22 DIAGNOSIS — J45901 Unspecified asthma with (acute) exacerbation: Secondary | ICD-10-CM | POA: Diagnosis not present

## 2014-08-22 DIAGNOSIS — Z86711 Personal history of pulmonary embolism: Secondary | ICD-10-CM | POA: Diagnosis not present

## 2014-08-22 DIAGNOSIS — R05 Cough: Secondary | ICD-10-CM | POA: Insufficient documentation

## 2014-08-22 DIAGNOSIS — Z8719 Personal history of other diseases of the digestive system: Secondary | ICD-10-CM | POA: Insufficient documentation

## 2014-08-22 DIAGNOSIS — Z79899 Other long term (current) drug therapy: Secondary | ICD-10-CM | POA: Insufficient documentation

## 2014-08-22 DIAGNOSIS — R059 Cough, unspecified: Secondary | ICD-10-CM

## 2014-08-22 DIAGNOSIS — Z8639 Personal history of other endocrine, nutritional and metabolic disease: Secondary | ICD-10-CM | POA: Insufficient documentation

## 2014-08-22 MED ORDER — PREDNISONE 20 MG PO TABS: 60.0000 mg | ORAL_TABLET | Freq: Every day | ORAL | Status: DC

## 2014-08-22 MED ORDER — ALBUTEROL SULFATE HFA 108 (90 BASE) MCG/ACT IN AERS: 2.0000 | INHALATION_SPRAY | RESPIRATORY_TRACT | Status: DC | PRN

## 2014-08-22 NOTE — ED Notes (Signed)
Pt escorted to discharge window. Verbalized understanding discharge instructions. In no acute distress.   

## 2014-08-22 NOTE — Discharge Instructions (Signed)
Cough, Adult  A cough is a reflex that helps clear your throat and airways. It can help heal the body or may be a reaction to an irritated airway. A cough may only last 2 or 3 weeks (acute) or may last more than 8 weeks (chronic).  CAUSES Acute cough:  Viral or bacterial infections. Chronic cough:  Infections.  Allergies.  Asthma.  Post-nasal drip.  Smoking.  Heartburn or acid reflux.  Some medicines.  Chronic lung problems (COPD).  Cancer. SYMPTOMS   Cough.  Fever.  Chest pain.  Increased breathing rate.  High-pitched whistling sound when breathing (wheezing).  Colored mucus that you cough up (sputum). TREATMENT   A bacterial cough may be treated with antibiotic medicine.  A viral cough must run its course and will not respond to antibiotics.  Your caregiver may recommend other treatments if you have a chronic cough. HOME CARE INSTRUCTIONS   Only take over-the-counter or prescription medicines for pain, discomfort, or fever as directed by your caregiver. Use cough suppressants only as directed by your caregiver.  Use a cold steam vaporizer or humidifier in your bedroom or home to help loosen secretions.  Sleep in a semi-upright position if your cough is worse at night.  Rest as needed.  Stop smoking if you smoke. SEEK IMMEDIATE MEDICAL CARE IF:   You have pus in your sputum.  Your cough starts to worsen.  You cannot control your cough with suppressants and are losing sleep.  You begin coughing up blood.  You have difficulty breathing.  You develop pain which is getting worse or is uncontrolled with medicine.  You have a fever. MAKE SURE YOU:   Understand these instructions.  Will watch your condition.  Will get help right away if you are not doing well or get worse. Document Released: 08/13/2010 Document Revised: 05/09/2011 Document Reviewed: 08/13/2010 ExitCare Patient Information 2015 [employer]. This information is not intended  to replace advice given to you by your health care provider. Make sure you discuss any questions you have with your health care provider.  

## 2014-08-22 NOTE — ED Provider Notes (Signed)
CSN: [id number]     Arrival date & time 08/22/14  7782 History   First MD Initiated Contact with Patient 08/22/14 0932     Chief Complaint  Patient presents with  . Chemical Inhalation      (Consider location/radiation/quality/duration/timing/severity/associated sxs/prior Treatment) Patient is a 29 y.o. female presenting with cough.  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 PE (pulmonary embolism) 2009    Unknown source, possibly due to contraception,car trip Dr Tessa Lerner labs all neg  . Multinodular goiter     2 nodules bx previously,seen by endo in the past U/S 6/12, 2 right thyroid nodules are stable, unchanged from prior  . Acid reflux   . Chest pain    Past Surgical History  Procedure Laterality Date  . Wisdom tooth extraction     Family History  Problem Relation Age of Onset  . Hypertension Mother   . Colon cancer Mother   . Deep vein thrombosis Father   . Diabetes Father   . Hypertension Father   . Colon cancer Maternal Grandmother   . Ovarian cancer Maternal Grandmother   . Diabetes Paternal Grandmother    History  Substance Use Topics  . Smoking status: Never Smoker   . Smokeless tobacco: Never Used  . Alcohol Use: 0.0 oz/week    0 Standard drinks or equivalent per week     Comment: rare   OB History    No data available     Review of Systems  Constitutional: Negative for fever and chills.  Respiratory: Positive for cough and shortness of breath.   Cardiovascular: Negative for chest pain.  All other systems reviewed and  are negative.     Allergies  Review of patient's allergies indicates no known allergies.  Home Medications   Prior to Admission medications   Medication Sig Start Date End Date Taking? Authorizing Provider  Biotin 1000 MCG tablet Take 1,000 mcg by mouth daily.   Yes Historical Provider, MD  Cholecalciferol (VITAMIN D3) 3000 UNITS TABS Take 1,000 Units by mouth daily.   Yes Historical Provider, MD  Levonorgestrel (SKYLA) 13.5 MG IUD 13.5 mg by Intrauterine route. Replace every 3 years   Yes Historical Provider, MD  Multiple Vitamins-Calcium (ONE-A-DAY WOMENS FORMULA PO) Take 1 tablet by mouth daily.   Yes Historical Provider, MD  naproxen sodium (ANAPROX) 220 MG tablet Take 220-440 mg by mouth every 12 (twelve) hours as needed (for pain).   Yes Historical Provider, MD  albuterol (PROVENTIL HFA;VENTOLIN HFA) 108 (90 BASE) MCG/ACT inhaler Inhale 2 puffs into the lungs every 2 (two) hours as needed for wheezing or shortness of breath (cough). 08/22/14   Debby Freiberg, MD  predniSONE (DELTASONE) 20 MG tablet Take 3 tablets (60 mg total) by mouth daily. 08/22/14   Debby Freiberg, MD   BP 95/62 mmHg  Pulse 97  Temp(Src) 98.1 F (36.7 C) (Oral)  Resp 19  SpO2 98% Physical Exam  Constitutional: She is oriented to person, place, and time. She appears well-developed and well-nourished.  HENT:  Head: Normocephalic and atraumatic.  Right Ear: External ear normal.  Left Ear: External ear normal.  Eyes: Conjunctivae and EOM are normal. Pupils are equal, round, and reactive to light.  Neck: Normal range of motion. Neck supple.  Cardiovascular: Normal rate, regular rhythm, normal heart sounds and intact distal pulses.   Pulmonary/Chest: Effort normal and breath sounds normal.  Abdominal: Soft. Bowel sounds are normal. There is no tenderness.  Musculoskeletal: Normal range of motion.  Neurological: She is alert and oriented to person, place, and time.  Skin: Skin is warm and dry.  Vitals  reviewed.   ED Course  Procedures (including critical care time) Labs Review Labs Reviewed - No data to display  Imaging Review Dg Chest 2 View  08/22/2014   CLINICAL DATA:  Patient inhaled chemical disinfectant at work earlier today. Shortness of breath and wheeze  EXAM: CHEST  2 VIEW  COMPARISON:  Chest radiograph January 16, 2008; chest CT Jul 02, 2013  FINDINGS: Lungs are clear. The heart size and pulmonary vascularity are normal. No adenopathy. No bone lesions.  IMPRESSION: No edema or consolidation.   Electronically Signed   By: Lowella Grip III M.D.   On: 08/22/2014 10:06     EKG Interpretation None      MDM   Final diagnoses:  Cough    29 y.o. 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  Poison control notified by EMS, no particular recommendations.  No more exposure than other non-sick people in the room.  Likely reactive airway disease with irritant, however will dc with prednisone and albuterol.  DC home in stable condition.    I have reviewed all laboratory and imaging studies if ordered as above  1. Cough         Debby Freiberg, MD 08/22/14 1019

## 2014-08-22 NOTE — ED Notes (Signed)
Per EMS, [facility] c/o SOB after a chemical inhalation.  EMS reports the Pt was attending an in-service for [employer], which are effervescent disinfectant tablets, and the Pt inhaled the chemical vapor.  Dry cough noted.  5mg  Albuterol given en route.    EMS called Poison Control which suggested humidified air and monitor for several hours.

## 2014-08-22 NOTE — ED Notes (Signed)
Bed: WA01 Expected date:  Expected time:  Means of arrival:  Comments: EMS-inhalation

## 2015-03-19 DIAGNOSIS — Z3202 Encounter for pregnancy test, result negative: Secondary | ICD-10-CM | POA: Diagnosis not present

## 2015-03-19 DIAGNOSIS — L918 Other hypertrophic disorders of the skin: Secondary | ICD-10-CM | POA: Diagnosis not present

## 2015-03-19 DIAGNOSIS — Z30433 Encounter for removal and reinsertion of intrauterine contraceptive device: Secondary | ICD-10-CM | POA: Diagnosis not present

## 2015-03-19 DIAGNOSIS — L308 Other specified dermatitis: Secondary | ICD-10-CM | POA: Diagnosis not present

## 2015-03-19 DIAGNOSIS — D225 Melanocytic nevi of trunk: Secondary | ICD-10-CM | POA: Diagnosis not present

## 2015-03-19 DIAGNOSIS — D2272 Melanocytic nevi of left lower limb, including hip: Secondary | ICD-10-CM | POA: Diagnosis not present

## 2015-03-19 DIAGNOSIS — D2261 Melanocytic nevi of right upper limb, including shoulder: Secondary | ICD-10-CM | POA: Diagnosis not present

## 2015-03-19 DIAGNOSIS — D2262 Melanocytic nevi of left upper limb, including shoulder: Secondary | ICD-10-CM | POA: Diagnosis not present

## 2015-03-19 DIAGNOSIS — L700 Acne vulgaris: Secondary | ICD-10-CM | POA: Diagnosis not present

## 2015-03-19 DIAGNOSIS — Z01419 Encounter for gynecological examination (general) (routine) without abnormal findings: Secondary | ICD-10-CM | POA: Diagnosis not present

## 2015-04-07 MED FILL — FLUOCINONIDE 0.05% CREAM: 0.05 | 10 days supply | Qty: 30 | Fill #0

## 2015-04-30 DIAGNOSIS — E042 Nontoxic multinodular goiter: Secondary | ICD-10-CM | POA: Diagnosis not present

## 2015-04-30 DIAGNOSIS — Z86711 Personal history of pulmonary embolism: Secondary | ICD-10-CM | POA: Diagnosis not present

## 2015-04-30 DIAGNOSIS — Z0000 Encounter for general adult medical examination without abnormal findings: Secondary | ICD-10-CM | POA: Diagnosis not present

## 2015-04-30 DIAGNOSIS — E559 Vitamin D deficiency, unspecified: Secondary | ICD-10-CM | POA: Diagnosis not present

## 2015-04-30 DIAGNOSIS — R5383 Other fatigue: Secondary | ICD-10-CM | POA: Diagnosis not present

## 2015-04-30 DIAGNOSIS — K900 Celiac disease: Secondary | ICD-10-CM | POA: Diagnosis not present

## 2015-04-30 DIAGNOSIS — Q242 Cor triatriatum: Secondary | ICD-10-CM | POA: Diagnosis not present

## 2015-07-21 MED FILL — CLINDAMYCIN PHOSP 1% LOTION: 1 | 30 days supply | Qty: 60 | Fill #0

## 2015-07-23 MED FILL — TRETINOIN 0.05% CREAM: 0.05 | 30 days supply | Qty: 45 | Fill #0

## 2015-08-18 DIAGNOSIS — E042 Nontoxic multinodular goiter: Secondary | ICD-10-CM | POA: Diagnosis not present

## 2015-12-23 MED FILL — TRETINOIN 0.05% CREAM: 0.05 | 30 days supply | Qty: 45 | Fill #1

## 2015-12-23 MED FILL — CLINDAMYCIN PHOSP 1% LOTION: 1 | 30 days supply | Qty: 60 | Fill #1

## 2016-01-26 MED FILL — CLINDAMYCIN PHOSP 1% LOTION: 1 | 30 days supply | Qty: 60 | Fill #2

## 2016-01-26 MED FILL — TRETINOIN 0.05% CREAM: 0.05 | 30 days supply | Qty: 45 | Fill #2

## 2016-03-24 ENCOUNTER — Other Ambulatory Visit: Payer: Self-pay | Admitting: Nurse Practitioner

## 2016-03-24 ENCOUNTER — Other Ambulatory Visit (HOSPITAL_COMMUNITY)
Admission: RE | Admit: 2016-03-24 | Discharge: 2016-03-24 | Disposition: A | Discharge status: Home / Self Care | Payer: 59 | Source: Ambulatory Visit | Attending: Obstetrics and Gynecology | Admitting: Obstetrics and Gynecology

## 2016-03-24 DIAGNOSIS — Z01419 Encounter for gynecological examination (general) (routine) without abnormal findings: Secondary | ICD-10-CM | POA: Diagnosis not present

## 2016-03-30 LAB — CYTOLOGY - PAP: Diagnosis: NEGATIVE

## 2016-05-03 DIAGNOSIS — E042 Nontoxic multinodular goiter: Secondary | ICD-10-CM | POA: Diagnosis not present

## 2016-05-03 DIAGNOSIS — Q242 Cor triatriatum: Secondary | ICD-10-CM | POA: Diagnosis not present

## 2016-05-03 DIAGNOSIS — Z86711 Personal history of pulmonary embolism: Secondary | ICD-10-CM | POA: Diagnosis not present

## 2016-05-03 DIAGNOSIS — K900 Celiac disease: Secondary | ICD-10-CM | POA: Diagnosis not present

## 2016-05-03 DIAGNOSIS — Z0000 Encounter for general adult medical examination without abnormal findings: Secondary | ICD-10-CM | POA: Diagnosis not present

## 2016-05-03 DIAGNOSIS — E559 Vitamin D deficiency, unspecified: Secondary | ICD-10-CM | POA: Diagnosis not present

## 2016-05-10 DIAGNOSIS — D2271 Melanocytic nevi of right lower limb, including hip: Secondary | ICD-10-CM | POA: Diagnosis not present

## 2016-05-10 DIAGNOSIS — D2262 Melanocytic nevi of left upper limb, including shoulder: Secondary | ICD-10-CM | POA: Diagnosis not present

## 2016-05-10 DIAGNOSIS — L700 Acne vulgaris: Secondary | ICD-10-CM | POA: Diagnosis not present

## 2016-05-10 DIAGNOSIS — D2272 Melanocytic nevi of left lower limb, including hip: Secondary | ICD-10-CM | POA: Diagnosis not present

## 2016-05-10 DIAGNOSIS — L813 Cafe au lait spots: Secondary | ICD-10-CM | POA: Diagnosis not present

## 2016-05-10 DIAGNOSIS — D220 Melanocytic nevi of lip: Secondary | ICD-10-CM | POA: Diagnosis not present

## 2016-05-10 DIAGNOSIS — D225 Melanocytic nevi of trunk: Secondary | ICD-10-CM | POA: Diagnosis not present

## 2016-05-10 DIAGNOSIS — D2261 Melanocytic nevi of right upper limb, including shoulder: Secondary | ICD-10-CM | POA: Diagnosis not present

## 2016-05-10 DIAGNOSIS — D224 Melanocytic nevi of scalp and neck: Secondary | ICD-10-CM | POA: Diagnosis not present

## 2016-05-19 DIAGNOSIS — H52222 Regular astigmatism, left eye: Secondary | ICD-10-CM | POA: Diagnosis not present

## 2016-05-19 DIAGNOSIS — H5213 Myopia, bilateral: Secondary | ICD-10-CM | POA: Diagnosis not present

## 2016-08-09 ENCOUNTER — Other Ambulatory Visit: Payer: Self-pay | Admitting: Internal Medicine

## 2016-08-09 DIAGNOSIS — E042 Nontoxic multinodular goiter: Secondary | ICD-10-CM

## 2016-08-22 ENCOUNTER — Ambulatory Visit
Admission: RE | Admit: 2016-08-22 | Discharge: 2016-08-22 | Disposition: A | Discharge status: Home / Self Care | Payer: 59 | Source: Ambulatory Visit | Attending: Internal Medicine | Admitting: Internal Medicine

## 2016-08-22 DIAGNOSIS — E041 Nontoxic single thyroid nodule: Secondary | ICD-10-CM | POA: Diagnosis not present

## 2016-08-22 DIAGNOSIS — E042 Nontoxic multinodular goiter: Secondary | ICD-10-CM

## 2016-08-26 ENCOUNTER — Other Ambulatory Visit: Payer: Self-pay | Admitting: Internal Medicine

## 2016-08-26 DIAGNOSIS — Z800 Family history of malignant neoplasm of digestive organs: Secondary | ICD-10-CM | POA: Diagnosis not present

## 2016-08-26 DIAGNOSIS — E042 Nontoxic multinodular goiter: Secondary | ICD-10-CM | POA: Diagnosis not present

## 2016-09-07 ENCOUNTER — Ambulatory Visit
Admission: RE | Admit: 2016-09-07 | Discharge: 2016-09-07 | Disposition: A | Discharge status: Home / Self Care | Payer: 59 | Source: Ambulatory Visit | Attending: Internal Medicine | Admitting: Internal Medicine

## 2016-09-07 ENCOUNTER — Other Ambulatory Visit (HOSPITAL_COMMUNITY)
Admission: RE | Admit: 2016-09-07 | Discharge: 2016-09-07 | Disposition: A | Discharge status: Home / Self Care | Payer: 59 | Source: Ambulatory Visit | Attending: Student | Admitting: Student

## 2016-09-07 DIAGNOSIS — E041 Nontoxic single thyroid nodule: Secondary | ICD-10-CM | POA: Diagnosis not present

## 2016-09-07 DIAGNOSIS — E042 Nontoxic multinodular goiter: Secondary | ICD-10-CM | POA: Diagnosis not present

## 2016-09-07 MED FILL — CLINDAMYCIN PHOSP 1% LOTION: 1 | 30 days supply | Qty: 60 | Fill #0

## 2016-09-07 MED FILL — TRETINOIN 0.05% CREAM: 0.05 | 30 days supply | Qty: 45 | Fill #0

## 2017-01-12 MED FILL — CLINDAMYCIN PHOSP 1% LOTION: 1 | 30 days supply | Qty: 60 | Fill #1

## 2017-03-07 MED FILL — TRETINOIN 0.05% CREAM: 0.05 | 30 days supply | Qty: 45 | Fill #1

## 2017-03-28 DIAGNOSIS — Z01419 Encounter for gynecological examination (general) (routine) without abnormal findings: Secondary | ICD-10-CM | POA: Diagnosis not present

## 2017-03-28 DIAGNOSIS — Z30431 Encounter for routine checking of intrauterine contraceptive device: Secondary | ICD-10-CM | POA: Diagnosis not present

## 2017-05-08 DIAGNOSIS — E559 Vitamin D deficiency, unspecified: Secondary | ICD-10-CM | POA: Diagnosis not present

## 2017-05-08 DIAGNOSIS — E042 Nontoxic multinodular goiter: Secondary | ICD-10-CM | POA: Diagnosis not present

## 2017-05-08 DIAGNOSIS — Q242 Cor triatriatum: Secondary | ICD-10-CM | POA: Diagnosis not present

## 2017-05-08 DIAGNOSIS — J9801 Acute bronchospasm: Secondary | ICD-10-CM | POA: Diagnosis not present

## 2017-05-08 DIAGNOSIS — Z806 Family history of leukemia: Secondary | ICD-10-CM | POA: Diagnosis not present

## 2017-05-08 DIAGNOSIS — Z0000 Encounter for general adult medical examination without abnormal findings: Secondary | ICD-10-CM | POA: Diagnosis not present

## 2017-05-08 MED FILL — VENTOLIN HFA 90 MCG INHALER: 108 (90 BAS | 25 days supply | Qty: 18 | Fill #0

## 2017-05-15 MED FILL — CLINDAMYCIN PHOSP 1% LOTION: 1 | 30 days supply | Qty: 60 | Fill #0

## 2017-06-08 DIAGNOSIS — L700 Acne vulgaris: Secondary | ICD-10-CM | POA: Diagnosis not present

## 2017-06-08 DIAGNOSIS — D2261 Melanocytic nevi of right upper limb, including shoulder: Secondary | ICD-10-CM | POA: Diagnosis not present

## 2017-06-08 DIAGNOSIS — D2272 Melanocytic nevi of left lower limb, including hip: Secondary | ICD-10-CM | POA: Diagnosis not present

## 2017-06-08 DIAGNOSIS — D2271 Melanocytic nevi of right lower limb, including hip: Secondary | ICD-10-CM | POA: Diagnosis not present

## 2017-06-08 DIAGNOSIS — D225 Melanocytic nevi of trunk: Secondary | ICD-10-CM | POA: Diagnosis not present

## 2017-06-08 DIAGNOSIS — D2262 Melanocytic nevi of left upper limb, including shoulder: Secondary | ICD-10-CM | POA: Diagnosis not present

## 2017-08-16 MED FILL — TRETINOIN 0.05% CREAM: 0.05 | 30 days supply | Qty: 45 | Fill #0

## 2017-08-23 DIAGNOSIS — R5383 Other fatigue: Secondary | ICD-10-CM | POA: Diagnosis not present

## 2017-08-23 DIAGNOSIS — E042 Nontoxic multinodular goiter: Secondary | ICD-10-CM | POA: Diagnosis not present

## 2017-09-21 DIAGNOSIS — J069 Acute upper respiratory infection, unspecified: Secondary | ICD-10-CM | POA: Diagnosis not present

## 2017-09-21 MED FILL — BENZONATATE 100 MG CAP: 100 | 10 days supply | Qty: 30 | Fill #0

## 2017-09-26 ENCOUNTER — Other Ambulatory Visit: Payer: Self-pay | Admitting: Internal Medicine

## 2017-09-26 DIAGNOSIS — E042 Nontoxic multinodular goiter: Secondary | ICD-10-CM

## 2017-10-05 MED FILL — CLINDAMYCIN PHOSP 1% LOTION: 1 | 20 days supply | Qty: 60 | Fill #0

## 2017-11-20 ENCOUNTER — Telehealth: Payer: 59 | Admitting: Family

## 2017-11-20 DIAGNOSIS — B373 Candidiasis of vulva and vagina: Secondary | ICD-10-CM | POA: Diagnosis not present

## 2017-11-20 DIAGNOSIS — B3731 Acute candidiasis of vulva and vagina: Secondary | ICD-10-CM

## 2017-11-20 MED ORDER — FLUCONAZOLE 150 MG PO TABS: 150.0000 mg | ORAL_TABLET | ORAL | 0 refills | Status: DC | PRN

## 2017-11-20 MED FILL — FLUCONAZOLE 150 MG TABS: 150 | 6 days supply | Qty: 3 | Fill #0

## 2017-11-20 NOTE — Progress Notes (Signed)

## 2017-12-14 MED FILL — TRETINOIN 0.05 % CREA: 0.05 | 30 days supply | Qty: 45 | Fill #1

## 2017-12-14 MED FILL — CLINDAMYCIN PHOSP 1% LOTION: 1 | 20 days supply | Qty: 60 | Fill #1

## 2017-12-19 ENCOUNTER — Telehealth: Payer: 59 | Admitting: Physician Assistant

## 2017-12-19 DIAGNOSIS — J069 Acute upper respiratory infection, unspecified: Secondary | ICD-10-CM | POA: Diagnosis not present

## 2017-12-19 MED ORDER — IPRATROPIUM BROMIDE 0.06 % NA SOLN: 2.0000 | Freq: Four times a day (QID) | NASAL | 12 refills | Status: AC

## 2017-12-19 MED ORDER — BENZONATATE 100 MG PO CAPS: 100.0000 mg | ORAL_CAPSULE | Freq: Three times a day (TID) | ORAL | 0 refills | Status: DC

## 2017-12-19 MED FILL — IPRATROPIUM 0.06% SPRAY: 0.06 | 10 days supply | Qty: 15 | Fill #0

## 2017-12-19 MED FILL — BENZONATATE 100 MG CAP: 100 | 10 days supply | Qty: 30 | Fill #0

## 2017-12-19 NOTE — Progress Notes (Signed)
We are sorry that you are not feeling well.  Here is how we plan to help!  Based on what you have shared with me it looks like you have sinusitis.  Sinusitis is inflammation and infection in the sinus cavities of the head.  Based on your presentation I believe you most likely have Acute Viral Sinusitis.This is an infection most likely caused by a virus. There is not specific treatment for viral sinusitis other than to help you with the symptoms until the infection runs its course.  You may use an oral decongestant such as Mucinex D or if you have glaucoma or high blood pressure use plain Mucinex. Saline nasal spray help and can safely be used as often as needed for congestion, I have prescribed: Ipratropium Bromide nasal spray 0.03% 2 sprays in eah nostril 2-3 times a day as well as benzonatate for cough.   Some authorities believe that zinc sprays or the use of Echinacea may shorten the course of your symptoms.  Sinus infections are not as easily transmitted as other respiratory infection, however we still recommend that you avoid close contact with loved ones, especially the very young and elderly.  Remember to wash your hands thoroughly throughout the day as this is the number one way to prevent the spread of infection!  Home Care:  Only take medications as instructed by your medical team.  Do not take these medications with alcohol.  A steam or ultrasonic humidifier can help congestion.  You can place a towel over your head and breathe in the steam from hot water coming from a faucet.  Avoid close contacts especially the very young and the elderly.  Cover your mouth when you cough or sneeze.  Always remember to wash your hands.  Get Help Right Away If:  You develop worsening fever or sinus pain.  You develop a severe head ache or visual changes.  Your symptoms persist after you have completed your treatment plan.  Make sure you  Understand these instructio2ns.  Will watch your  condition.  Will get help right away if you are not doing well or get worse.  Your e-visit answers were reviewed by a board certified advanced clinical practitioner to complete your personal care plan.  Depending on the condition, your plan could have included both over the counter or prescription medications.  If there is a problem please reply  once you have received a response from your provider.  Your safety is important to [place].  If you have drug allergies check your prescription carefully.    You can use MyChart to ask questions about today's visit, request a non-urgent call back, or ask for a work or school excuse for 24 hours related to this e-Visit. If it has been greater than 24 hours you will need to follow up with your provider, or enter a new e-Visit to address those concerns.  You will get an e-mail in the next two days asking about your experience.  I hope that your e-visit has been valuable and will speed your recovery. Thank you for using e-visits.

## 2018-02-22 MED FILL — CLINDAMYCIN PHOSP 1% LOTION: 1 | 20 days supply | Qty: 60 | Fill #2

## 2018-02-22 MED FILL — TRETINOIN 0.05 % CREA: 0.05 | 30 days supply | Qty: 45 | Fill #2

## 2018-03-14 ENCOUNTER — Other Ambulatory Visit: Payer: 59

## 2018-06-14 MED FILL — BETAMETHASONE DP AUG 0.05%: 0.05 | 30 days supply | Qty: 50 | Fill #0

## 2018-06-14 MED FILL — CLINDAMYCIN PHOS-BENZOYL PE: 1-5 | 30 days supply | Qty: 25 | Fill #0

## 2018-06-28 MED FILL — TRETINOIN 0.05 % CREA: 0.05 | 30 days supply | Qty: 45 | Fill #0

## 2018-07-24 ENCOUNTER — Other Ambulatory Visit: Payer: 59

## 2018-08-03 MED FILL — CLINDAMYCIN PHOS-BENZOYL PE: 1-5 | 30 days supply | Qty: 25 | Fill #1

## 2018-08-08 ENCOUNTER — Ambulatory Visit
Admission: RE | Admit: 2018-08-08 | Discharge: 2018-08-08 | Disposition: A | Discharge status: Home / Self Care | Payer: No Typology Code available for payment source | Source: Ambulatory Visit | Attending: Internal Medicine | Admitting: Internal Medicine

## 2018-08-08 DIAGNOSIS — E042 Nontoxic multinodular goiter: Secondary | ICD-10-CM

## 2018-10-12 MED FILL — CLINDAMYCIN PHOS-BENZOYL PE: 1-5 | 30 days supply | Qty: 25 | Fill #2

## 2018-10-12 MED FILL — CONCERTA 36 MG TABLET ER: 36 | 30 days supply | Qty: 30 | Fill #0

## 2018-11-14 ENCOUNTER — Telehealth: Payer: No Typology Code available for payment source | Admitting: Nurse Practitioner

## 2018-11-14 DIAGNOSIS — N3000 Acute cystitis without hematuria: Secondary | ICD-10-CM

## 2018-11-14 MED ORDER — NITROFURANTOIN MONOHYD MACRO 100 MG PO CAPS: 100.0000 mg | ORAL_CAPSULE | Freq: Two times a day (BID) | ORAL | 0 refills | Status: DC

## 2018-11-14 MED FILL — NITROFURANTOIN MONO-MCR 100: 100 | 7 days supply | Qty: 14 | Fill #0

## 2018-11-14 NOTE — Progress Notes (Signed)

## 2018-11-16 MED FILL — CLINDAMYCIN PHOS-BENZOYL PE: 1-5 | 30 days supply | Qty: 25 | Fill #3

## 2018-11-16 MED FILL — TRETINOIN 0.05 % CREA: 0.05 | 30 days supply | Qty: 45 | Fill #1

## 2018-11-16 MED FILL — CONCERTA 36 MG TABLET ER: 36 | 30 days supply | Qty: 30 | Fill #0

## 2018-11-28 ENCOUNTER — Telehealth: Payer: No Typology Code available for payment source | Admitting: Family

## 2018-11-28 DIAGNOSIS — H60501 Unspecified acute noninfective otitis externa, right ear: Secondary | ICD-10-CM | POA: Diagnosis not present

## 2018-11-28 MED ORDER — CIPROFLOXACIN-DEXAMETHASONE 0.3-0.1 % OT SUSP: 4.0000 [drp] | Freq: Two times a day (BID) | OTIC | 0 refills | Status: DC

## 2018-11-28 MED FILL — CIPROFLOXACIN-DEXAMETHASONE: 0.3-0.1 | 18 days supply | Qty: 8 | Fill #0

## 2018-11-28 NOTE — Progress Notes (Signed)
E Visit for Swimmer's Ear  We are sorry that you are not feeling well. Here is how we plan to help!  Based on what you have shared with me it looks like you have swimmers ear. Swimmer's ear is a redness or swelling, irritation, or infection of your outer ear canal.  These symptoms usually occur within a few days of swimming.  Your ear canal is a tube that goes from the opening of the ear to the eardrum.  When water stays in your ear canal, germs can grow.  This is a painful condition that often happens to children and swimmers of all ages.  It is not contagious and oral antibiotics are not required to treat uncomplicated swimmer's ear.  The usual symptoms include: Itching inside the ear, Redness or a sense of swelling in the ear, Pain when the ear is tugged on when pressure is placed on the ear, Pus draining from the infected ear. and I have prescribed: Ciprofloxin 0.2% and dexamethasone otic suspension 4 drops in affected ears twice daily for 7 days  Approximately 5 minutes was spent documenting and reviewing patient's chart.    In certain cases swimmer's ear may progress to a more serious bacterial infection of the middle or inner ear.  If you have a fever 102 and up and significantly worsening symptoms, this could indicate a more serious infection moving to the middle/inner and needs face to face evaluation in an office by a provider.  Your symptoms should improve over the next 3 days and should resolve in about 7 days.  HOME CARE:   Wash your hands frequently.  Do not place the tip of the bottle on your ear or touch it with your fingers.  You can take Acetominophen 650 mg every 4-6 hours as needed for pain.  If pain is severe or moderate, you can apply a heating pad (set on low) or hot water bottle (wrapped in a towel) to outer ear for 20 minutes.  This will also increase drainage.  Avoid ear plugs  Do not use Q-tips  After showers, help the water run out by tilting your head to one  side.  GET HELP RIGHT AWAY IF:   Fever is over 102.2 degrees.  You develop progressive ear pain or hearing loss.  Ear symptoms persist longer than 3 days after treatment.  MAKE SURE YOU:   Understand these instructions.  Will watch your condition.  Will get help right away if you are not doing well or get worse.  TO PREVENT SWIMMER'S EAR:  Use a bathing cap or custom fitted swim molds to keep your ears dry.  Towel off after swimming to dry your ears.  Tilt your head or pull your earlobes to allow the water to escape your ear canal.  If there is still water in your ears, consider using a hairdryer on the lowest setting.  Thank you for choosing an e-visit. Your e-visit answers were reviewed by a board certified advanced clinical practitioner to complete your personal care plan. Depending upon the condition, your plan could have included both over the counter or prescription medications. Please review your pharmacy choice. Be sure that the pharmacy you have chosen is open so that you can pick up your prescription now.  If there is a problem you may message your provider in [place] to have the prescription routed to another pharmacy. Your safety is important to [place]. If you have drug allergies check your prescription carefully.  For the next  24 hours, you can use MyChart to ask questions about today's visit, request a non-urgent call back, or ask for a work or school excuse from your e-visit provider. You will get an email in the next two days asking about your experience. I hope that your e-visit has been valuable and will speed your recovery.

## 2018-12-14 MED FILL — CONCERTA 36 MG TABLET ER: 36 | 30 days supply | Qty: 30 | Fill #0

## 2018-12-21 MED FILL — CLINDAMYCIN PHOS-BENZOYL PE: 1-5 | 30 days supply | Qty: 25 | Fill #4

## 2018-12-21 MED FILL — BETAMETHASONE DP AUG 0.05%: 0.05 | 30 days supply | Qty: 50 | Fill #1

## 2019-01-01 MED FILL — CONCERTA 18 MG TABLET ER: 18 | 30 days supply | Qty: 30 | Fill #0

## 2019-01-04 MED FILL — FLUOCINONIDE 0.05% CREAM: 0.05 | 30 days supply | Qty: 30 | Fill #0

## 2019-01-11 ENCOUNTER — Telehealth: Payer: No Typology Code available for payment source | Admitting: Physician Assistant

## 2019-01-11 DIAGNOSIS — N898 Other specified noninflammatory disorders of vagina: Secondary | ICD-10-CM | POA: Diagnosis not present

## 2019-01-11 MED ORDER — FLUCONAZOLE 150 MG PO TABS: 150.0000 mg | ORAL_TABLET | Freq: Every day | ORAL | 0 refills | Status: DC

## 2019-01-11 NOTE — Progress Notes (Signed)
I spent more than 5 min, but less than 83min on this E-Visit. We are sorry that you are not feeling well. Here is how we plan to help! Based on what you shared with me it looks like you: May have a yeast vaginosis  Vaginosis is an inflammation of the vagina that can result in discharge, itching and pain. The cause is usually a change in the normal balance of vaginal bacteria or an infection. Vaginosis can also result from reduced estrogen levels after menopause.  The most common causes of vaginosis are:   Bacterial vaginosis which results from an overgrowth of one on several organisms that are normally present in your vagina.   Yeast infections which are caused by a naturally occurring fungus called candida.   Vaginal atrophy (atrophic vaginosis) which results from the thinning of the vagina from reduced estrogen levels after menopause.   Trichomoniasis which is caused by a parasite and is commonly transmitted by sexual intercourse.  Factors that increase your risk of developing vaginosis include: Marland Kitchen Medications, such as antibiotics and steroids . Uncontrolled diabetes . Use of hygiene products such as bubble bath, vaginal spray or vaginal deodorant . Douching . Wearing damp or tight-fitting clothing . Using an intrauterine device (IUD) for birth control . Hormonal changes, such as those associated with pregnancy, birth control pills or menopause . Sexual activity . Having a sexually transmitted infection  Your treatment plan is A single Diflucan (fluconazole) 150mg  tablet once.  I have electronically sent this prescription into the pharmacy that you have chosen.  Be sure to take all of the medication as directed. Stop taking any medication if you develop a rash, tongue swelling or shortness of breath. Mothers who are breast feeding should consider pumping and discarding their breast milk while on these antibiotics. However, there is no consensus that infant exposure at these doses would  be harmful.  Remember that medication creams can weaken latex condoms. Marland Kitchen   HOME CARE:  Good hygiene may prevent some types of vaginosis from recurring and may relieve some symptoms:  . Avoid baths, hot tubs and whirlpool spas. Rinse soap from your outer genital area after a shower, and dry the area well to prevent irritation. Don't use scented or harsh soaps, such as those with deodorant or antibacterial action. Marland Kitchen Avoid irritants. These include scented tampons and pads. . Wipe from front to back after using the toilet. Doing so avoids spreading fecal bacteria to your vagina.  Other things that may help prevent vaginosis include:  Marland Kitchen Don't douche. Your vagina doesn't require cleansing other than normal bathing. Repetitive douching disrupts the normal organisms that reside in the vagina and can actually increase your risk of vaginal infection. Douching won't clear up a vaginal infection. . Use a latex condom. Both female and female latex condoms may help you avoid infections spread by sexual contact. . Wear cotton underwear. Also wear pantyhose with a cotton crotch. If you feel comfortable without it, skip wearing underwear to bed. Yeast thrives in [employer] Your symptoms should improve in the next day or two.  GET HELP RIGHT AWAY IF:  . You have pain in your lower abdomen ( pelvic area or over your ovaries) . You develop nausea or vomiting . You develop a fever . Your discharge changes or worsens . You have persistent pain with intercourse . You develop shortness of breath, a rapid pulse, or you faint.  These symptoms could be signs of problems or infections that need to  be evaluated by a medical provider now.  MAKE SURE YOU    Understand these instructions.  Will watch your condition.  Will get help right away if you are not doing well or get worse.  Your e-visit answers were reviewed by a board certified advanced clinical practitioner to complete your personal care  plan. Depending upon the condition, your plan could have included both over the counter or prescription medications. Please review your pharmacy choice to make sure that you have choses a pharmacy that is open for you to pick up any needed prescription, Your safety is important to [place]. If you have drug allergies check your prescription carefully.   You can use MyChart to ask questions about today's visit, request a non-urgent call back, or ask for a work or school excuse for 24 hours related to this e-Visit. If it has been greater than 24 hours you will need to follow up with your provider, or enter a new e-Visit to address those concerns. You will get a MyChart message within the next two days asking about your experience. I hope that your e-visit has been valuable and will speed your recovery.

## 2019-01-14 MED ORDER — FLUCONAZOLE 150 MG PO TABS: 150.0000 mg | ORAL_TABLET | Freq: Every day | ORAL | 0 refills | Status: DC

## 2019-01-14 MED FILL — FLUCONAZOLE 150 MG TABS: 150 | 1 days supply | Qty: 1 | Fill #0

## 2019-01-14 NOTE — Addendum Note (Signed)
Addended by: Rodney Booze on: 01/14/2019 09:40 AM   Modules accepted: Orders

## 2019-01-30 MED FILL — TRETINOIN 0.05 % CREA: 0.05 | 30 days supply | Qty: 45 | Fill #2

## 2019-01-30 MED FILL — CLINDAMYCIN PHOS-BENZOYL PE: 1-5 | 30 days supply | Qty: 25 | Fill #5

## 2019-03-04 MED FILL — CONCERTA 54 MG TABLET ER: 54 | 30 days supply | Qty: 30 | Fill #0

## 2019-03-28 MED FILL — TRETINOIN 0.05 % CREA: 0.05 | 30 days supply | Qty: 45 | Fill #3

## 2019-03-28 MED FILL — CLINDAMYCIN PHOS-BENZOYL PE: 1-5 | 30 days supply | Qty: 25 | Fill #6

## 2019-04-05 MED FILL — CONCERTA 54 MG TABLET ER: 54 | 30 days supply | Qty: 30 | Fill #0

## 2019-05-06 MED FILL — CLINDAMYCIN PHOS-BENZOYL PE: 1-5 | 30 days supply | Qty: 25 | Fill #7

## 2019-05-06 MED FILL — CONCERTA 54 MG TABLET ER: 54 | 30 days supply | Qty: 30 | Fill #0

## 2019-06-14 MED FILL — CONCERTA 54 MG TABLET ER: 54 | 30 days supply | Qty: 30 | Fill #0

## 2019-06-21 ENCOUNTER — Other Ambulatory Visit (HOSPITAL_COMMUNITY): Payer: Self-pay | Admitting: Dermatology

## 2019-06-21 MED FILL — FLUOROURACIL 5 % CREA: 5 | 30 days supply | Qty: 40 | Fill #0

## 2019-06-21 MED FILL — CLINDAMYCIN PHOS-BENZOYL PE: 1-5 | 30 days supply | Qty: 50 | Fill #0

## 2019-06-21 MED FILL — TRETINOIN 0.05 % CREA: 0.05 | 30 days supply | Qty: 20 | Fill #0

## 2019-07-23 MED FILL — CONCERTA 54 MG TABLET ER: 54 | 30 days supply | Qty: 30 | Fill #0

## 2019-08-13 ENCOUNTER — Other Ambulatory Visit: Payer: Self-pay | Admitting: Internal Medicine

## 2019-08-13 DIAGNOSIS — E049 Nontoxic goiter, unspecified: Secondary | ICD-10-CM

## 2019-08-22 ENCOUNTER — Ambulatory Visit
Admission: RE | Admit: 2019-08-22 | Discharge: 2019-08-22 | Disposition: A | Discharge status: Home / Self Care | Payer: No Typology Code available for payment source | Source: Ambulatory Visit | Attending: Internal Medicine | Admitting: Internal Medicine

## 2019-08-22 DIAGNOSIS — E049 Nontoxic goiter, unspecified: Secondary | ICD-10-CM

## 2019-08-22 MED FILL — CONCERTA 54 MG TABLET ER: 54 | 30 days supply | Qty: 30 | Fill #0

## 2019-08-28 ENCOUNTER — Other Ambulatory Visit: Payer: Self-pay | Admitting: Internal Medicine

## 2019-08-28 DIAGNOSIS — E042 Nontoxic multinodular goiter: Secondary | ICD-10-CM

## 2019-09-05 MED FILL — CONCERTA 54 MG TABLET ER: 54 | 30 days supply | Qty: 30 | Fill #0

## 2019-09-11 ENCOUNTER — Other Ambulatory Visit: Payer: Self-pay | Admitting: Internal Medicine

## 2019-09-12 ENCOUNTER — Other Ambulatory Visit: Payer: No Typology Code available for payment source

## 2019-09-18 ENCOUNTER — Ambulatory Visit
Admission: RE | Admit: 2019-09-18 | Discharge: 2019-09-18 | Disposition: A | Discharge status: Home / Self Care | Payer: No Typology Code available for payment source | Source: Ambulatory Visit | Attending: Internal Medicine | Admitting: Internal Medicine

## 2019-09-18 ENCOUNTER — Other Ambulatory Visit (HOSPITAL_COMMUNITY)
Admission: RE | Admit: 2019-09-18 | Discharge: 2019-09-18 | Disposition: A | Discharge status: Home / Self Care | Payer: No Typology Code available for payment source | Source: Ambulatory Visit | Attending: Internal Medicine | Admitting: Internal Medicine

## 2019-09-18 DIAGNOSIS — E041 Nontoxic single thyroid nodule: Secondary | ICD-10-CM | POA: Insufficient documentation

## 2019-09-18 DIAGNOSIS — E042 Nontoxic multinodular goiter: Secondary | ICD-10-CM

## 2019-09-19 LAB — CYTOLOGY - NON PAP

## 2019-10-01 ENCOUNTER — Encounter (HOSPITAL_COMMUNITY): Payer: Self-pay

## 2019-10-07 MED FILL — CLINDAMYCIN PHOS-BENZOYL PE: 1-5 | 30 days supply | Qty: 50 | Fill #1

## 2019-10-07 MED FILL — BETAMETHASONE DP AUG 0.05%: 0.05 | 25 days supply | Qty: 50 | Fill #0

## 2019-10-08 MED FILL — CONCERTA 54 MG TABLET ER: 54 | 30 days supply | Qty: 30 | Fill #0

## 2019-10-10 MED FILL — TRETINOIN 0.05 % CREA: 0.05 | 30 days supply | Qty: 20 | Fill #1

## 2019-11-08 MED FILL — CONCERTA 54 MG TABLET ER: 54 | 30 days supply | Qty: 30 | Fill #0

## 2019-12-09 MED FILL — CONCERTA 54 MG TABLET ER: 54 | 30 days supply | Qty: 30 | Fill #0

## 2019-12-25 ENCOUNTER — Other Ambulatory Visit: Payer: Self-pay

## 2019-12-25 ENCOUNTER — Other Ambulatory Visit (HOSPITAL_COMMUNITY): Payer: Self-pay | Admitting: Emergency Medicine

## 2019-12-25 ENCOUNTER — Ambulatory Visit (INDEPENDENT_AMBULATORY_CARE_PROVIDER_SITE_OTHER): Payer: No Typology Code available for payment source

## 2019-12-25 ENCOUNTER — Ambulatory Visit
Admission: RE | Admit: 2019-12-25 | Discharge: 2019-12-25 | Disposition: A | Discharge status: Home / Self Care | Payer: No Typology Code available for payment source | Source: Ambulatory Visit | Attending: Emergency Medicine | Admitting: Emergency Medicine

## 2019-12-25 VITALS — BP 109/74 | HR 108 | Temp 97.8°F | Resp 19

## 2019-12-25 DIAGNOSIS — R059 Cough, unspecified: Secondary | ICD-10-CM

## 2019-12-25 DIAGNOSIS — U071 COVID-19: Secondary | ICD-10-CM

## 2019-12-25 DIAGNOSIS — R0602 Shortness of breath: Secondary | ICD-10-CM | POA: Diagnosis not present

## 2019-12-25 MED ORDER — ALBUTEROL SULFATE HFA 108 (90 BASE) MCG/ACT IN AERS: 2.0000 | INHALATION_SPRAY | Freq: Four times a day (QID) | RESPIRATORY_TRACT | 2 refills | Status: AC | PRN

## 2019-12-25 MED ORDER — AEROCHAMBER PLUS FLO-VU MEDIUM MISC: 1.0000 | Freq: Once | 0 refills | Status: AC

## 2019-12-25 MED ORDER — BENZONATATE 100 MG PO CAPS: 100.0000 mg | ORAL_CAPSULE | Freq: Three times a day (TID) | ORAL | 0 refills | Status: DC

## 2019-12-25 MED ORDER — AZITHROMYCIN 250 MG PO TABS: 250.0000 mg | ORAL_TABLET | Freq: Every day | ORAL | 0 refills | Status: DC

## 2019-12-25 MED FILL — BENZONATATE 100 MG CAPS: 100 | 5 days supply | Qty: 30 | Fill #0

## 2019-12-25 MED FILL — AZITHROMYCIN 250 MG TABLET: 250 | 5 days supply | Qty: 6 | Fill #0

## 2019-12-25 MED FILL — ALBUTEROL SULFATE HFA 108 (: 108 (90 BAS | 25 days supply | Qty: 18 | Fill #0

## 2019-12-25 NOTE — Discharge Instructions (Signed)

## 2019-12-25 NOTE — ED Triage Notes (Signed)
Pt states she had a positive covid test and for 2 weeks has experienced coughing and N/V/D for the last week. Pt was advised after a televisit with her primary care to follow up with the ER due to possibility of pneumonia. Pt is aox4 and ambulatory.

## 2019-12-25 NOTE — ED Provider Notes (Signed)
[facility]    CSN: [id number] Arrival date & time: 12/25/19  1141      History   Chief Complaint Chief Complaint  Patient presents with  . Cough    x 2 weeks  . Emesis    x 1 week  . Nausea    x 1 week    HPI [name] is a 34 y.o. female  With history of PE, Covid presenting for worsening Covid symptoms.  Patient became symptomatic 10/11.  Had a televisit with her PCP who recommended following up in person for chest x-ray due to possibility of pneumonia.  Patient denies shortness of breath, though states her chest feels heavy.  Denies lightheadedness, weakness.  No posttussive emesis.  Did have nausea with vomiting, last on Sunday: Tolerating oral intake well, though does have decreased appetite.  Past Medical History:  Diagnosis Date  . Acid reflux   . Chest pain   . Multinodular goiter    2 nodules bx previously,seen by endo in the past U/S 6/12, 2 right thyroid nodules are stable, unchanged from prior  . PE (pulmonary embolism) 2009   Unknown source, possibly due to contraception,car trip Dr Tessa Lerner labs all neg    Patient Active Problem List   Diagnosis Date Noted  . Dyspnea and respiratory abnormality 04/21/2014  . Chest pain 02/17/2014  . Abnormal echocardiogram findings without diagnosis 07/08/2013  . SOB (shortness of breath) 05/22/2013  . Palpitations 04/22/2013    Past Surgical History:  Procedure Laterality Date  . WISDOM TOOTH EXTRACTION      OB History   No obstetric history on file.      Home Medications    Prior to Admission medications   Medication Sig Start Date End Date Taking? Authorizing Provider  Biotin 1000 MCG tablet Take 1,000 mcg by mouth daily.   Yes [provider]  Cholecalciferol (VITAMIN D3) 3000 UNITS TABS Take 1,000 Units by mouth daily.   Yes [provider]  ipratropium (ATROVENT) 0.06 % nasal spray Place 2 sprays into both nostrils 4 (four) times daily. 12/19/17  Yes  Tereasa Coop, PA-C  Levonorgestrel (SKYLA) 13.5 MG IUD 13.5 mg by Intrauterine route. Replace every 3 years   Yes [provider]  Multiple Vitamins-Calcium (ONE-A-DAY WOMENS FORMULA PO) Take 1 tablet by mouth daily.   Yes [provider]  naproxen sodium (ANAPROX) 220 MG tablet Take 220-440 mg by mouth every 12 (twelve) hours as needed (for pain).   Yes [provider]  albuterol (VENTOLIN HFA) 108 (90 Base) MCG/ACT inhaler Inhale 2 puffs into the lungs every 6 (six) hours as needed for wheezing or shortness of breath. 12/25/19   Hall-Potvin, [place], PA-C  azithromycin (ZITHROMAX) 250 MG tablet Take 1 tablet (250 mg total) by mouth daily. Take first 2 tablets together, then 1 every day until finished. 12/25/19   Hall-Potvin, [place], PA-C  benzonatate (TESSALON) 100 MG capsule Take 1-2 capsules (100-200 mg total) by mouth 3 (three) times daily. 12/25/19   Hall-Potvin, [place], PA-C  ciprofloxacin-dexamethasone (CIPRODEX) OTIC suspension Place 4 drops into the right ear 2 (two) times daily. 11/28/18   Sharion Balloon, FNP  Spacer/Aero-Holding Chambers (AEROCHAMBER PLUS FLO-VU MEDIUM) MISC 1 each by Other route once for 1 dose. 12/25/19 12/25/19  Hall-Potvin, [place], PA-C    Family History Family History  Problem Relation Age of Onset  . Hypertension Mother   . Colon cancer Mother   . Deep vein thrombosis Father   .  Diabetes Father   . Hypertension Father   . Colon cancer Maternal Grandmother   . Ovarian cancer Maternal Grandmother   . Diabetes Paternal Grandmother     Social History Social History   Tobacco Use  . Smoking status: Never Smoker  . Smokeless tobacco: Never Used  Vaping Use  . Vaping Use: Never used  Substance Use Topics  . Alcohol use: Yes    Alcohol/week: 0.0 standard drinks    Comment: rare  . Drug use: No     Allergies   Patient has no known allergies.   Review of Systems Review of Systems  Constitutional: Positive  for appetite change. Negative for fatigue and fever.  HENT: Negative for ear pain, sinus pain, sore throat and voice change.   Eyes: Negative for pain, redness and visual disturbance.  Respiratory: Positive for cough and chest tightness. Negative for shortness of breath and wheezing.   Cardiovascular: Positive for chest pain. Negative for palpitations and leg swelling.  Gastrointestinal: Negative for abdominal pain, diarrhea and vomiting.  Musculoskeletal: Negative for arthralgias and myalgias.  Skin: Negative for rash and wound.  Neurological: Negative for syncope and headaches.     Physical Exam Triage Vital Signs ED Triage Vitals  Enc Vitals Group     BP 12/25/19 1155 109/74     Pulse Rate 12/25/19 1155 (!) 108     Resp 12/25/19 1155 19     Temp 12/25/19 1155 97.8 F (36.6 C)     Temp Source 12/25/19 1155 Oral     SpO2 12/25/19 1155 98 %     Weight --      Height --      Head Circumference --      Peak Flow --      Pain Score 12/25/19 1156 7     Pain Loc --      Pain Edu? --      Excl. in [place]? --    No data found.  Updated Vital Signs BP 109/74 (BP Location: Left Arm)   Pulse (!) 108   Temp 97.8 F (36.6 C) (Oral)   Resp 19   SpO2 98%   Visual Acuity Right Eye Distance:   Left Eye Distance:   Bilateral Distance:    Right Eye Near:   Left Eye Near:    Bilateral Near:     Physical Exam Constitutional:      General: She is not in acute distress.    Appearance: She is not ill-appearing or diaphoretic.  HENT:     Head: Normocephalic and atraumatic.     Mouth/Throat:     Mouth: Mucous membranes are moist.     Pharynx: Oropharynx is clear. No oropharyngeal exudate or posterior oropharyngeal erythema.  Eyes:     General: No scleral icterus.    Conjunctiva/sclera: Conjunctivae normal.     Pupils: Pupils are equal, round, and reactive to light.  Neck:     Comments: Trachea midline, negative JVD Cardiovascular:     Rate and Rhythm: Regular rhythm. Tachycardia  present.     Pulses: Normal pulses.     Heart sounds: Normal heart sounds. No murmur heard.  No gallop.   Pulmonary:     Effort: Pulmonary effort is normal. No respiratory distress.     Breath sounds: No stridor. No wheezing, rhonchi or rales.  Musculoskeletal:     Cervical back: Neck supple. No tenderness.  Lymphadenopathy:     Cervical: No cervical adenopathy.  Skin:  Capillary Refill: Capillary refill takes less than 2 seconds.     Coloration: Skin is not jaundiced or pale.     Findings: No rash.  Neurological:     General: No focal deficit present.     Mental Status: She is alert and oriented to person, place, and time.      UC Treatments / Results  Labs (all labs ordered are listed, but only abnormal results are displayed) Labs Reviewed - No data to display  EKG   Radiology DG Chest 2 View  Result Date: 12/25/2019 CLINICAL DATA:  Cough and shortness of breath.  COVID-19 positive EXAM: CHEST - 2 VIEW COMPARISON:  August 22, 2014 FINDINGS: There is no edema or airspace opacity. There is mild interstitial thickening throughout the lungs. Heart size and pulmonary vascularity are normal. No adenopathy. No bone lesions. IMPRESSION: Mild interstitial thickening may be due to underlying bronchitis or potentially could represent underlying atypical organism pneumonia. There is no well-defined airspace opacity or consolidation. Cardiac silhouette normal. No adenopathy. Electronically Signed   By: Lowella Grip III M.D.   On: 12/25/2019 12:26    Procedures Procedures (including critical care time)  Medications Ordered in UC Medications - No data to display  Initial Impression / Assessment and Plan / UC Course  I have reviewed the triage vital signs and the nursing notes.  Pertinent labs & imaging results that were available during my care of the patient were reviewed by me and considered in my medical decision making (see chart for details).     Afebrile, nontoxic in  office today.  Chest x-ray with mild interstitial thickening; could consider underlying bronchitis or atypical organism pneumonia.  EKG done in office, reviewed by me and compared to previous from 04/10/2013: NSR with reticular 83 bpm.  No QTC prolongation, ST elevation or depression.  Waveforms stable in all leads: Nonacute.  Return precautions discussed, pt verbalized understanding and is agreeable to plan. Final Clinical Impressions(s) / UC Diagnoses   Final diagnoses:  [id number]     Discharge Instructions     Tessalon for cough. Start flonase, atrovent nasal spray for nasal congestion/drainage. You can use over the counter nasal saline rinse such as neti pot for nasal congestion. Keep hydrated, your urine should be clear to pale yellow in color. Tylenol/motrin for fever and pain. Monitor for any worsening of symptoms, chest pain, shortness of breath, wheezing, swelling of the throat, go to the emergency department for further evaluation needed.     ED Prescriptions    Medication Sig Dispense Auth. Provider   benzonatate (TESSALON) 100 MG capsule Take 1-2 capsules (100-200 mg total) by mouth 3 (three) times daily. 30 capsule Hall-Potvin, [place], PA-C   azithromycin (ZITHROMAX) 250 MG tablet Take 1 tablet (250 mg total) by mouth daily. Take first 2 tablets together, then 1 every day until finished. 6 tablet Hall-Potvin, [place], PA-C   albuterol (VENTOLIN HFA) 108 (90 Base) MCG/ACT inhaler Inhale 2 puffs into the lungs every 6 (six) hours as needed for wheezing or shortness of breath. 8 g Hall-Potvin, [place], PA-C   Spacer/Aero-Holding Chambers (AEROCHAMBER PLUS FLO-VU MEDIUM) MISC 1 each by Other route once for 1 dose. 1 each Hall-Potvin, [place], PA-C     PDMP not reviewed this encounter.   Hall-Potvin, [place], [place] 12/25/19 1304

## 2019-12-31 MED FILL — DOXYCYCLINE HYCLATE 100 MG: 100 | 7 days supply | Qty: 14 | Fill #0

## 2019-12-31 MED FILL — SYMBICORT 160-4.5 MCG INH: 160-4.5 | 30 days supply | Qty: 10 | Fill #0

## 2020-01-11 MED FILL — CLINDAMYCIN PHOS-BENZOYL PE: 1-5 | 30 days supply | Qty: 50 | Fill #2

## 2020-01-11 MED FILL — TRETINOIN 0.05 % CREA: 0.05 | 30 days supply | Qty: 20 | Fill #2

## 2020-01-16 ENCOUNTER — Other Ambulatory Visit (HOSPITAL_COMMUNITY): Payer: Self-pay | Admitting: Internal Medicine

## 2020-01-16 MED FILL — CONCERTA 54 MG TABLET ER: 54 | 30 days supply | Qty: 30 | Fill #0

## 2020-01-29 MED FILL — predniSONE 10 MG (21) TBPK: 10 | 6 days supply | Qty: 21 | Fill #0

## 2020-02-18 MED FILL — CONCERTA 54 MG TABLET ER: 54 | 30 days supply | Qty: 30 | Fill #0

## 2020-03-24 DIAGNOSIS — U099 Post covid-19 condition, unspecified: Secondary | ICD-10-CM | POA: Diagnosis not present

## 2020-03-24 DIAGNOSIS — R253 Fasciculation: Secondary | ICD-10-CM | POA: Diagnosis not present

## 2020-03-24 DIAGNOSIS — R000 Tachycardia, unspecified: Secondary | ICD-10-CM | POA: Diagnosis not present

## 2020-03-24 DIAGNOSIS — E559 Vitamin D deficiency, unspecified: Secondary | ICD-10-CM | POA: Diagnosis not present

## 2020-03-24 DIAGNOSIS — R61 Generalized hyperhidrosis: Secondary | ICD-10-CM | POA: Diagnosis not present

## 2020-03-24 DIAGNOSIS — R059 Cough, unspecified: Secondary | ICD-10-CM | POA: Diagnosis not present

## 2020-03-24 DIAGNOSIS — Q242 Cor triatriatum: Secondary | ICD-10-CM | POA: Diagnosis not present

## 2020-03-24 MED FILL — CONCERTA 54 MG TABLET ER: 54 | 30 days supply | Qty: 30 | Fill #0

## 2020-03-24 MED FILL — CLINDAMYCIN PHOS-BENZOYL PE: 1-5 | 30 days supply | Qty: 50 | Fill #3

## 2020-03-24 MED FILL — TRETINOIN 0.05 % CREA: 0.05 | 30 days supply | Qty: 20 | Fill #3

## 2020-03-31 ENCOUNTER — Encounter: Payer: Self-pay | Admitting: Neurology

## 2020-03-31 ENCOUNTER — Ambulatory Visit: Payer: Managed Care, Other (non HMO) | Admitting: Neurology

## 2020-03-31 ENCOUNTER — Telehealth: Payer: Self-pay | Admitting: Neurology

## 2020-03-31 ENCOUNTER — Other Ambulatory Visit (HOSPITAL_COMMUNITY): Payer: Self-pay | Admitting: Neurology

## 2020-03-31 VITALS — BP 124/87 | HR 80 | Ht 65.0 in | Wt 178.0 lb

## 2020-03-31 DIAGNOSIS — U099 Post covid-19 condition, unspecified: Secondary | ICD-10-CM

## 2020-03-31 DIAGNOSIS — R2990 Unspecified symptoms and signs involving the nervous system: Secondary | ICD-10-CM

## 2020-03-31 DIAGNOSIS — M5416 Radiculopathy, lumbar region: Secondary | ICD-10-CM | POA: Diagnosis not present

## 2020-03-31 DIAGNOSIS — R251 Tremor, unspecified: Secondary | ICD-10-CM | POA: Diagnosis not present

## 2020-03-31 DIAGNOSIS — M5431 Sciatica, right side: Secondary | ICD-10-CM | POA: Diagnosis not present

## 2020-03-31 DIAGNOSIS — R4189 Other symptoms and signs involving cognitive functions and awareness: Secondary | ICD-10-CM

## 2020-03-31 MED ORDER — GABAPENTIN 300 MG PO CAPS: ORAL_CAPSULE | ORAL | 5 refills | Status: DC

## 2020-03-31 MED ORDER — ETODOLAC 400 MG PO TABS: 400.0000 mg | ORAL_TABLET | Freq: Two times a day (BID) | ORAL | 5 refills | Status: DC

## 2020-03-31 MED FILL — ETODOLAC 400 MG TABS: 400 | 30 days supply | Qty: 60 | Fill #0

## 2020-03-31 MED FILL — GABAPENTIN 300 MG CAPSULE: 300 | 30 days supply | Qty: 120 | Fill #0

## 2020-03-31 NOTE — Progress Notes (Signed)
[facility]  PATIENT: [name] DOB: Mar 15, 1985  REFERRING DOCTOR OR PCP: Yaakov Guthrie, MD SOURCE: Patient, notes from primary care  _________________________________   HISTORICAL  CHIEF COMPLAINT:  Chief Complaint  Patient presents with  . New Patient (Initial Visit)    "since Thanksgiving, I am having 'brain fog', involuntary twitchin and sharp pain in right buttock" Room 12, alone in room    HISTORY OF PRESENT ILLNESS:  I had the pleasure seeing your patient, [name], at [facility] for neurologic consultation regarding her cognitive issues, hand twitching and right sciatica starting after a COVID-19 infection.  She is a 35 year old woman who had Covid-19 in late October 2021.  She went to the [facility] urgent Care and was told she had pneumonia and had an x-ray showing possible pneumonia.   She was treated with Ventolin inhaler, prednisone and a Z-pack.   She is noticing several symptoms since the infection.   Specifically, she notes brain fog and twitching and right buttock pain.  She has reduced focus and attention.  Her short term memory is reduced.   She has asked the same questions over again a few times.   She has ADHD and has been on Concerta.  It helps her some but not as much as it used to help.  She also notes a headache (holocephalic but right > left).   A few headaches have been intense though most are a dull ache.  She feels the twitching is all over but she has a mild tremor in both hands.  She notes this more with her right dominant hand.   She has not noted any activity increasing or decreasing the tremor.    However, it was worse when she took Ventolin.     The pain radiates down the right leg into the foot with a sharp quality.    Pain comes and goes.  This weekend was more severe but the last two days has been milder.    She does not note it is worse if she sits on the right buttock.    She has no lumbar imaging  studies.   The pain is severe at times.  Tylenol and Advil help some for a short time.   She notes general hand or leg weakness but no specific weakness on the right.   Weakness quickly improves after walking.    She notes some tingling in the arch of the right foot and the buttock goes numb.     She notes more pain if the leg is raised (like putting on shoes).      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   REVIEW OF SYSTEMS: Constitutional: No fevers, chills, sweats, or change in appetite Eyes: No visual changes, double vision, eye pain Ear, nose and throat: No hearing loss, ear pain, nasal congestion, sore throat Cardiovascular: No chest pain, palpitations.  Congenital heart issue diagnosed after an episode of syncope. Respiratory: No shortness of breath at rest or with exertion.   No wheezes GastrointestinaI: No nausea, vomiting, diarrhea, abdominal pain, fecal incontinence Genitourinary: No dysuria, urinary retention or frequency.  No nocturia. Musculoskeletal: No neck pain, back pain Integumentary: No rash, pruritus, skin lesions Neurological: as above Psychiatric: No depression at this time.  No anxiety.  ADHD. Endocrine: No palpitations, diaphoresis, change in appetite, change in weigh or increased thirst Hematologic/Lymphatic: No anemia, purpura, petechiae. Allergic/Immunologic: No itchy/runny eyes, nasal congestion, recent  allergic reactions, rashes  ALLERGIES: No Known Allergies  HOME MEDICATIONS:  Current Outpatient Medications:  .  albuterol (VENTOLIN HFA) 108 (90 Base) MCG/ACT inhaler, Inhale 2 puffs into the lungs every 6 (six) hours as needed for wheezing or shortness of breath., Disp: 8 g, Rfl: 2 .  azithromycin (ZITHROMAX) 250 MG tablet, Take 1 tablet (250 mg total) by mouth daily. Take first 2 tablets together, then 1 every day until finished., Disp: 6 tablet, Rfl: 0 .  benzonatate (TESSALON) 100 MG capsule, Take 1-2  capsules (100-200 mg total) by mouth 3 (three) times daily., Disp: 30 capsule, Rfl: 0 .  Biotin 1000 MCG tablet, Take 1,000 mcg by mouth daily., Disp: , Rfl:  .  Cholecalciferol (VITAMIN D3) 3000 UNITS TABS, Take 1,000 Units by mouth daily., Disp: , Rfl:  .  ciprofloxacin-dexamethasone (CIPRODEX) OTIC suspension, Place 4 drops into the right ear 2 (two) times daily., Disp: 7.5 mL, Rfl: 0 .  ipratropium (ATROVENT) 0.06 % nasal spray, Place 2 sprays into both nostrils 4 (four) times daily., Disp: 15 mL, Rfl: 12 .  Levonorgestrel (SKYLA) 13.5 MG IUD, 13.5 mg by Intrauterine route. Replace every 3 years, Disp: , Rfl:  .  Multiple Vitamins-Calcium (ONE-A-DAY WOMENS FORMULA PO), Take 1 tablet by mouth daily., Disp: , Rfl:  .  naproxen sodium (ANAPROX) 220 MG tablet, Take 220-440 mg by mouth every 12 (twelve) hours as needed (for pain)., Disp: , Rfl:   PAST MEDICAL HISTORY: Past Medical History:  Diagnosis Date  . Acid reflux   . Chest pain   . Multinodular goiter    2 nodules bx previously,seen by endo in the past U/S 6/12, 2 right thyroid nodules are stable, unchanged from prior  . PE (pulmonary embolism) 2009   Unknown source, possibly due to contraception,car trip Dr Tessa Lerner labs all neg    PAST SURGICAL HISTORY: Past Surgical History:  Procedure Laterality Date  . WISDOM TOOTH EXTRACTION      FAMILY HISTORY: Family History  Problem Relation Age of Onset  . Hypertension Mother   . Colon cancer Mother   . Deep vein thrombosis Father   . Diabetes Father   . Hypertension Father   . Colon cancer Maternal Grandmother   . Ovarian cancer Maternal Grandmother   . Diabetes Paternal Grandmother     SOCIAL HISTORY:  Social History   Socioeconomic History  . Marital status: Married    Spouse name: Not on file  . Number of children: Not on file  . Years of education: Not on file  . Highest education level: Not on file  Occupational History  . Occupation: Landscape architect  Tobacco Use  . Smoking status: Never Smoker  . Smokeless tobacco: Never Used  Vaping Use  . Vaping Use: Never used  Substance and Sexual Activity  . Alcohol use: Yes    Alcohol/week: 0.0 standard drinks    Comment: rare  . Drug use: No  . Sexual activity: Yes    Birth control/protection: I.U.D.  Other Topics Concern  . Not on file  Social History Narrative   Lives with husband   Right Handed   Drinks 1-2 cup daily   Social Determinants of Health   Financial Resource Strain: Not on file  Food Insecurity: Not on file  Transportation Needs: Not on file  Physical Activity: Not on file  Stress: Not on file  Social Connections: Not on file  Intimate Partner Violence: Not on file     PHYSICAL  EXAM  Vitals:   03/31/20 1413  BP: 124/87  Pulse: 80  Weight: 178 lb (80.7 kg)  Height: 5\' 5"  (1.651 m)    Body mass index is 29.62 kg/m.   General: The patient is well-developed and well-nourished and in no acute distress  HEENT:  Head is [place]/AT.  Sclera are anicteric.  Funduscopic exam shows normal optic discs and retinal vessels.  Neck: No carotid bruits are noted.  The neck is nontender.  Cardiovascular: The heart has a regular rate and rhythm with a normal S1 and S2. There were no murmurs, gallops or rubs.    Skin: Extremities are without rash or  edema.  Musculoskeletal: She has mild to moderate tenderness over the lower lumbar paraspinal muscles and moderate tenderness over the right piriformis muscle.  Neurologic Exam  Mental status: The patient is alert and oriented x 3 at the time of the examination. The patient has apparent normal recent and remote memory.  Focus/attention seemed reduced during the interaction.Marland Kitchen   Speech is normal though she paused a few times to come up with the right word.  Cranial nerves: Extraocular movements are full. Pupils are equal, round, and reactive to light and accomodation.  Visual fields are full.  Facial symmetry is present.  There is good facial sensation to soft touch bilaterally.Facial strength is normal.  Trapezius and sternocleidomastoid strength is normal. No dysarthria is noted.    No obvious hearing deficits are noted.  Motor: She has a low amplitude high-frequency tremor in the right hand.  Muscle bulk is normal.   Tone is normal. Strength is  5 / 5 in all 4 extremities.   Sensory: Sensory testing is intact to pinprick, soft touch and vibration sensation in all 4 extremities.  Coordination: Cerebellar testing reveals good finger-nose-finger and heel-to-shin bilaterally.  Gait and station: Station is normal.   Gait is arthritic.  Due to pain, tandem gait is mildly wide..  Romberg is negative.   Reflexes: Deep tendon reflexes are symmetric and normal bilaterally.   Plantar responses are flexor.    DIAGNOSTIC DATA (LABS, IMAGING, TESTING) - I reviewed patient records, labs, notes, testing and imaging myself where available.  Lab Results  Component Value Date   WBC 8.6 05/15/2008   HGB 14.7 05/15/2008   HCT 42.2 05/15/2008   MCV 93.9 05/15/2008   PLT 285 05/15/2008      Component Value Date/Time   NA 140 05/15/2008 1334   K 3.9 05/15/2008 1334   CL 105 05/15/2008 1334   CO2 26 05/15/2008 1334   GLUCOSE 96 05/15/2008 1334   BUN 11 05/15/2008 1334   CREATININE 0.82 07/25/2013 1443   CALCIUM 9.1 05/15/2008 1334   PROT 7.6 05/15/2008 1334   ALBUMIN 4.7 05/15/2008 1334   AST 21 05/15/2008 1334   ALT 19 05/15/2008 1334   ALKPHOS 41 05/15/2008 1334   BILITOT 0.5 05/15/2008 1334   GFRNONAA >90 07/25/2013 1443   GFRAA >90 07/25/2013 1443       ASSESSMENT AND PLAN  Post-COVID chronic neurologic symptoms - Plan: MR BRAIN WO CONTRAST  Lumbar radiculopathy - Plan: MR LUMBAR SPINE WO CONTRAST  Tremor - Plan: MR BRAIN WO CONTRAST  Sciatica of right side - Plan: MR LUMBAR SPINE WO CONTRAST  Cognitive change - Plan: MR BRAIN WO CONTRAST   In summary, Ms. Ficek is a 35 year old woman who  had COVID-17 December 2019 and has had several symptoms since that infection.  The reduced focus and mild tremor  could be part of a long Covid syndrome.  The tremor could also represent very mild benign essential tremor.  It does not appear to be severe enough to treat at this time.  If it worsens consider a beta-blocker.  Because there has been no improvement of the cognitive issues, I will check an MRI of the brain to determine if she had any ischemic changes that could have occurred as part of the COVID-19 infection or independently.  Additionally we need to check an MRI of the lumbar spine due to her sciatica, or right L5 or S1 radiculopathy.  To help with her symptoms I placed her on etodolac and gabapentin for the pain.  Hopefully the gabapentin will allow her to sleep better as well.  She will return to see me based on the results of the studies or call us if there are new or worsening neurologic symptoms.  Thank you for asking me to see Ms. Olevia Bowens.  Please let me know if I can be of further assistance with her or other patients in the future.   Daniesha Driver A. Felecia Shelling, MD, [facility] [id number], [id number] PM Certified in Neurology, Clinical Neurophysiology, Sleep Medicine and Neuroimaging  [facility] [address] [place], [place] [address] [phone]

## 2020-03-31 NOTE — Telephone Encounter (Signed)
cigna order sent to GI. They will obtain the auth and reach out to the patient to schedule.  °

## 2020-04-06 NOTE — Telephone Encounter (Signed)
Cigna did not approve the MRI Lumbar spine wo contrast.  "Based on [facility] Spine Imaging Guidelines Section(s): SP 6.1 Lower Extremity Pain with Neurological Features (Radiculopathy, Radiculitis, or Plexopathy and Neuropathy) with or without Low Back (Lumbar Spine) Pain and 1.0 General Guidelines, we cannot approve this request. Your records show that you are having lower back pain that travels to your hip and/or leg. The request cannot be approved because: Imaging requires six weeks of provider directed treatment to be completed. Supported treatments include (but are not limited to) drugs for swelling or pain, an in office workout (physical therapy), and/or oral or injected steroids. This must have been completed in the past three months without improved symptoms. Contact (via office visit, phone, email, or messaging) must occur after the treatment is completed. This has not been met because: -You have not completed six weeks of provider directed treatment. -The provider directed treatment did not occur within the last three months. -Symptoms must be the same or worse after treatment to support imaging. -There was no contact with your provider after completing treatment. "  The case number is [id number]. If you would like to do a peer to peer it does need to be scheduled. The phone number is [phone]. They stated there is no deadline to do the peer to peer just that it would need to be scheduled.   Right now she is scheduled to have both MRI's on Wednesday 04/08/20.

## 2020-04-06 NOTE — Telephone Encounter (Signed)
Dr. Felecia Shelling- would you like to call and do peer to peer? Raquel Sarna put # to call below

## 2020-04-08 ENCOUNTER — Ambulatory Visit
Admission: RE | Admit: 2020-04-08 | Discharge: 2020-04-08 | Disposition: A | Discharge status: Home / Self Care | Payer: Managed Care, Other (non HMO) | Source: Ambulatory Visit | Attending: Neurology | Admitting: Neurology

## 2020-04-08 DIAGNOSIS — R4189 Other symptoms and signs involving cognitive functions and awareness: Secondary | ICD-10-CM | POA: Diagnosis not present

## 2020-04-08 DIAGNOSIS — R251 Tremor, unspecified: Secondary | ICD-10-CM

## 2020-04-08 DIAGNOSIS — U099 Post covid-19 condition, unspecified: Secondary | ICD-10-CM

## 2020-04-08 DIAGNOSIS — M5431 Sciatica, right side: Secondary | ICD-10-CM

## 2020-04-08 DIAGNOSIS — M5416 Radiculopathy, lumbar region: Secondary | ICD-10-CM

## 2020-04-08 DIAGNOSIS — R2990 Unspecified symptoms and signs involving the nervous system: Secondary | ICD-10-CM

## 2020-04-08 NOTE — Telephone Encounter (Signed)
Noted, thank you! Teara with [facility] is aware and the patient is scheduled for today.

## 2020-04-08 NOTE — Telephone Encounter (Signed)
Her lumbar spine auth # is [id number]

## 2020-04-08 NOTE — Telephone Encounter (Signed)
Dr. Felecia Shelling is on the phone now completing P2P

## 2020-04-08 NOTE — Telephone Encounter (Signed)
Pt has called this morning re: the 11 :00 appointment for the Lumbar Spine MRI. Pt is asking if Dr Felecia Shelling plans to call her insurance for a Peer to Peer for the MRI to be covered.  Please call

## 2020-04-09 ENCOUNTER — Telehealth: Payer: Self-pay | Admitting: *Deleted

## 2020-04-09 ENCOUNTER — Other Ambulatory Visit (HOSPITAL_COMMUNITY): Payer: Self-pay | Admitting: Neurology

## 2020-04-09 MED ORDER — CYCLOBENZAPRINE HCL 5 MG PO TABS: 5.0000 mg | ORAL_TABLET | Freq: Three times a day (TID) | ORAL | 1 refills | Status: DC | PRN

## 2020-04-09 MED FILL — CYCLOBENZAPRINE HCL 5 MG TA: 5 | 30 days supply | Qty: 90 | Fill #0

## 2020-04-09 NOTE — Telephone Encounter (Signed)
-----   Message from Britt Bottom, MD sent at 04/08/2020  6:19 PM EST ----- Please let her know:  1.   The MRI of the lumbar spine shows mild disc degenerative changes at L3-L4 and L4-L5.  There were no herniated disks.  There is no nerve root compression. 2.    The MRI of the brain was normal.  However, the pituitary gland appeared to be pushed down some.  Sometimes this just happens and does not mean anything.  However, we also can see this if pressure is high in the spinal fluid.  When I examined her, I did not see any swelling in the back of the eyes but I would like her to have an eye exam where an ophthalmologist or optometrist dilates the eyes to get a better look.   If they do not see any swelling then  this is nothing to be concerned about.

## 2020-04-09 NOTE — Telephone Encounter (Signed)
Spoke with Dr. Felecia Shelling. He recommends adding flexeril to medication regimen. Directions: 5mg  po TID #90, 1 refill to see if this helps her back pain. He wants her to see Dr. Sabra Heck first. Depending on their findings, he will then decide if she needs LP or not.

## 2020-04-09 NOTE — Telephone Encounter (Signed)
Called pt back. Relayed Dr. Garth Bigness recommendation. She is agreeable with this plan. She will call optometry and try and get appt with Dr. Sabra Heck next week. She will have him fax over notes to Dr. Felecia Shelling at [phone] once available for him to review. I e-scribed flexeril to pharmacy. She will call back if she has any further questions/concerns.

## 2020-04-09 NOTE — Telephone Encounter (Signed)
Pt. called RN back & asks that she is called around 10 am this morning.

## 2020-04-09 NOTE — Addendum Note (Signed)
Addended by: Wyvonnia Lora on: 04/09/2020 01:48 PM   Modules accepted: Orders

## 2020-04-09 NOTE — Telephone Encounter (Signed)
Called pt back. Relayed Dr. Garth Bigness results. She verbalized understanding. She is questioning what is causing her back pain. Pain going into right buttocks and right foot is asleep.  She will call Dr. Philipp Deputy (optometrist) to scheduled appt to be evaluated for swelling in back of eyes.  Advised I will clarify if Dr. Felecia Shelling wanted LP as well to check for increased pressure who if she should see Dr. Sabra Heck first. Pt states she will be unavailable from 10-12pm. Advised I will call back after 1pm.

## 2020-04-09 NOTE — Telephone Encounter (Signed)
LVM for pt to call about results. °

## 2020-04-14 ENCOUNTER — Ambulatory Visit: Payer: Self-pay | Admitting: Cardiology

## 2020-04-15 ENCOUNTER — Other Ambulatory Visit (HOSPITAL_COMMUNITY): Payer: Self-pay | Admitting: Internal Medicine

## 2020-04-15 DIAGNOSIS — Z79899 Other long term (current) drug therapy: Secondary | ICD-10-CM | POA: Diagnosis not present

## 2020-04-15 DIAGNOSIS — F419 Anxiety disorder, unspecified: Secondary | ICD-10-CM | POA: Diagnosis not present

## 2020-04-15 DIAGNOSIS — F902 Attention-deficit hyperactivity disorder, combined type: Secondary | ICD-10-CM | POA: Diagnosis not present

## 2020-04-17 DIAGNOSIS — H52223 Regular astigmatism, bilateral: Secondary | ICD-10-CM | POA: Diagnosis not present

## 2020-04-17 DIAGNOSIS — H47391 Other disorders of optic disc, right eye: Secondary | ICD-10-CM | POA: Diagnosis not present

## 2020-04-17 DIAGNOSIS — H5211 Myopia, right eye: Secondary | ICD-10-CM | POA: Diagnosis not present

## 2020-04-17 DIAGNOSIS — H53483 Generalized contraction of visual field, bilateral: Secondary | ICD-10-CM | POA: Diagnosis not present

## 2020-04-17 DIAGNOSIS — H5340 Unspecified visual field defects: Secondary | ICD-10-CM | POA: Diagnosis not present

## 2020-04-23 ENCOUNTER — Ambulatory Visit: Payer: Self-pay | Admitting: Cardiology

## 2020-04-23 DIAGNOSIS — Z01419 Encounter for gynecological examination (general) (routine) without abnormal findings: Secondary | ICD-10-CM | POA: Diagnosis not present

## 2020-04-23 DIAGNOSIS — K921 Melena: Secondary | ICD-10-CM | POA: Diagnosis not present

## 2020-04-24 ENCOUNTER — Other Ambulatory Visit: Payer: Self-pay

## 2020-04-24 ENCOUNTER — Ambulatory Visit (INDEPENDENT_AMBULATORY_CARE_PROVIDER_SITE_OTHER): Payer: 59 | Admitting: Cardiology

## 2020-04-24 ENCOUNTER — Encounter: Payer: Self-pay | Admitting: Cardiology

## 2020-04-24 ENCOUNTER — Ambulatory Visit (INDEPENDENT_AMBULATORY_CARE_PROVIDER_SITE_OTHER): Payer: 59

## 2020-04-24 VITALS — BP 120/74 | HR 119 | Ht 65.0 in | Wt 180.0 lb

## 2020-04-24 DIAGNOSIS — R0602 Shortness of breath: Secondary | ICD-10-CM

## 2020-04-24 DIAGNOSIS — R002 Palpitations: Secondary | ICD-10-CM | POA: Diagnosis not present

## 2020-04-24 DIAGNOSIS — Q242 Cor triatriatum: Secondary | ICD-10-CM | POA: Diagnosis not present

## 2020-04-24 LAB — D-DIMER, QUANTITATIVE: D-DIMER: 0.23 mg/L FEU (ref 0.00–0.49)

## 2020-04-24 NOTE — Patient Instructions (Addendum)
Medication Instructions:  Your physician recommends that you continue on your current medications as directed. Please refer to the Current Medication list given to you today.  *If you need a refill on your cardiac medications before your next appointment, please call your pharmacy*   Lab Work: TODAY:  D-Dimer Come back for morning labs: TSH, CBC, CMET, LH, FSH, AM CORTISOL  If you have labs (blood work) drawn today and your tests are completely normal, you will receive your results only by: Marland Kitchen MyChart Message (if you have MyChart) OR . A paper copy in the mail If you have any lab test that is abnormal or we need to change your treatment, we will call you to review the results.   Testing/Procedures: Your physician has requested that you have an echocardiogram. Echocardiography is a painless test that uses sound waves to create images of your heart. It provides your doctor with information about the size and shape of your heart and how well your heart's chambers and valves are working. This procedure takes approximately one hour. There are no restrictions for this procedure.  Your physician has recommended that you wear an event monitor. Event monitors are medical devices that record the heart's electrical activity. Doctors most often [place] these monitors to diagnose arrhythmias. Arrhythmias are problems with the speed or rhythm of the heartbeat. The monitor is a small, portable device. You can wear one while you do your normal daily activities. This is usually used to diagnose what is causing palpitations/syncope (passing out).    Follow-Up: At [facility], you and your health needs are our priority.  As part of our continuing mission to provide you with exceptional heart care, we have created designated Provider Care Teams.  These Care Teams include your primary Cardiologist (physician) and Advanced Practice Providers (APPs -  Physician Assistants and Nurse Practitioners) who all work together  to provide you with the care you need, when you need it.  We recommend signing up for the patient portal called "MyChart".  Sign up information is provided on this After Visit Summary.  MyChart is used to connect with patients for Virtual Visits (Telemedicine).  Patients are able to view lab/test results, encounter notes, upcoming appointments, etc.  Non-urgent messages can be sent to your provider as well.   To learn more about what you can do with MyChart, go to [email or web address].    Your next appointment:   4 week(s)  The format for your next appointment:   In Person  Provider:   Fransico Him, MD   Other Instructions Bryn Gulling- Long Term Monitor Instructions   Your physician has requested you wear your ZIO patch monitor_14_days.   This is a single patch monitor.  [employer] supplies one patch monitor per enrollment.  Additional stickers are not available.   Please do not apply patch if you will be having a Nuclear Stress Test, Echocardiogram, Cardiac CT, MRI, or Chest Xray during the time frame you would be wearing the monitor. The patch cannot be worn during these tests.  You cannot remove and re-apply the ZIO XT patch monitor.   Your ZIO patch monitor will be sent USPS Priority mail from [facility] directly to your home address. The monitor may also be mailed to a PO BOX if home delivery is not available.   It may take 3-5 days to receive your monitor after you have been enrolled.   Once you have received you monitor, please review enclosed instructions.  Your monitor has already  been registered assigning a specific monitor serial # to you.   Applying the monitor   Shave hair from upper left chest.   Hold abrader disc by orange tab.  Rub abrader in 40 strokes over left upper chest as indicated in your monitor instructions.   Clean area with 4 enclosed alcohol pads .  Use all pads to assure are is cleaned thoroughly.  Let dry.   Apply patch as indicated in monitor  instructions.  Patch will be place under collarbone on left side of chest with arrow pointing upward.   Rub patch adhesive wings for 2 minutes.Remove white label marked "1".  Remove white label marked "2".  Rub patch adhesive wings for 2 additional minutes.   While looking in a mirror, press and release button in center of patch.  A small green light will flash 3-4 times .  This will be your only indicator the monitor has been turned on.     Do not shower for the first 24 hours.  You may shower after the first 24 hours.   Press button if you feel a symptom. You will hear a small click.  Record Date, Time and Symptom in the Patient Log Book.   When you are ready to remove patch, follow instructions on last 2 pages of Patient Log Book.  Stick patch monitor onto last page of Patient Log Book.   Place Patient Log Book in [place] box.  Use locking tab on box and tape box closed securely.  The [employer] has [employer] on it.  Please place in mailbox as soon as possible.  Your physician should have your test results approximately 7 days after the monitor has been mailed back to [facility].   Call [place] at [phone] if you have questions regarding your ZIO XT patch monitor.  Call them immediately if you see an orange light blinking on your monitor.   If your monitor falls off in less than 4 days contact our Monitor department at [phone].  If your monitor becomes loose or falls off after 4 days call [employer] at [phone] for suggestions on securing your monitor.

## 2020-04-24 NOTE — Progress Notes (Signed)
Cardiology Consult  Note    Date:  04/26/2020   ID:  [name], DOB May 26, 1985, MRN [id number]  PCP:  Vernie Shanks, MD  Cardiologist:  Fransico Him, MD   Chief Complaint  Patient presents with  . New Patient (Initial Visit)    Palpitations, SOB and cor triatriatum sinister    History of Present Illness:  [name] is a 35 y.o. female who is being seen today for the evaluation of cor TriaTriatum at the request of Vernie Shanks, MD.  This is a 35yo female with a hx of cor triatriatum sinister, GERD and remote PE in the setting of oral contraception and long car ride who is referred to [facility] cardiac care and to evaluate for palpitations and SOB. 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  She also had palpitations and event monitor showed PACs and PVCs.  She was referred to Dr. Aida Puffer with [facility] pediatric cardiology and she underwent ETT showing no ischemia or chest pain at > 10 mets but did have DOE.    She then saw Dr. Jeralyn Bennett at [facility] in 2016 as she was contemplating getting pregnant.  She also was complaining at that time of presyncopal spells for a year.  Review of echo from 2016 showed no evidence of obstruction from her membrane in the LA, no PHTN and normal RV size and function.  It was felt her CP was noncardiac since it was atypical and she had normal coronary takeoffs on echo and ETT was onrmal.  Her risk of pregnancy was felt to be low to moderate but he recommended followup with high risk OB if she became pregnant. She has a history of a pulmonary embolism that was presumed to be hormone (OCP) related. Given the hypercoaguable state of pregnancy, it was felt she may  benefit from Lovenox during her pregnancy.  She is now here today to reestablish cardiac care.  She has been having problems with palpitations that are random in occurrence but make her feel dizzy and SOB.  She has not had any syncope.  She is having palpitations in the office today.  She also has been having episodes of SOB that are random as well.  She denies any LE edema, syncope, PND, orthopnea.        Past Medical History:  Diagnosis Date  . Acid reflux   . Chest pain   . 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 Multinodular goiter    2 nodules bx previously,seen by endo in the past U/S 6/12, 2 right thyroid nodules are stable, unchanged from prior  . PE (pulmonary embolism) 2009   Unknown source, possibly due to contraception,car trip Dr Tessa Lerner labs all neg    Past Surgical History:  Procedure Laterality Date  . WISDOM TOOTH EXTRACTION      Current Medications: Current Meds  Medication Sig  . albuterol (VENTOLIN HFA) 108 (90 Base) MCG/ACT inhaler Inhale 2 puffs into the lungs every 6 (six) hours as needed for wheezing or shortness of breath.  . Biotin 1000 MCG tablet Take 1,000 mcg by mouth daily.  . Cholecalciferol (VITAMIN D3) 3000 UNITS TABS Take 1,000 Units by mouth daily.  Marland Kitchen  gabapentin (NEURONTIN) 300 MG capsule One po q AM, one po q PM and 2 po qHS  . ipratropium (ATROVENT) 0.06 % nasal spray Place 2 sprays into both nostrils 4 (four) times daily.  . Levonorgestrel 13.5 MG IUD 13.5 mg by Intrauterine route. Replace every 3 years  . Multiple Vitamins-Calcium (ONE-A-DAY WOMENS FORMULA PO) Take 1 tablet by mouth daily.  . naproxen sodium (ANAPROX) 220 MG tablet Take 220-440 mg by mouth every 12 (twelve) hours as needed (for pain).    Allergies:   Patient has no known allergies.   Social History   Socioeconomic History  . Marital status: Married    Spouse name: Not on file  . Number of children: Not on file  . Years of education: Not on file  . Highest  education level: Not on file  Occupational History  . Occupation: Emergency planning/management officer  Tobacco Use  . Smoking status: Never Smoker  . Smokeless tobacco: Never Used  Vaping Use  . Vaping Use: Never used  Substance and Sexual Activity  . Alcohol use: Yes    Alcohol/week: 0.0 standard drinks    Comment: rare  . Drug use: No  . Sexual activity: Yes    Birth control/protection: I.U.D.  Other Topics Concern  . Not on file  Social History Narrative   Lives with husband   Right Handed   Drinks 1-2 cup daily   Social Determinants of Health   Financial Resource Strain: Not on file  Food Insecurity: Not on file  Transportation Needs: Not on file  Physical Activity: Not on file  Stress: Not on file  Social Connections: Not on file     Family History:  The patient's family history includes Colon cancer in her maternal grandmother and mother; Deep vein thrombosis in her father; Diabetes in her father and paternal grandmother; Hypertension in her father and mother; Ovarian cancer in her maternal grandmother.   ROS:   Please see the history of present illness.    ROS All other systems reviewed and are negative.  No flowsheet data found.     PHYSICAL EXAM:   VS:  BP 120/74   Pulse (!) 119   Ht 5\' 5"  (1.651 m)   Wt 180 lb (81.6 kg)   SpO2 99%   BMI 29.95 kg/m    GEN: Well nourished, well developed, in no acute distress  HEENT: normal  Neck: no JVD, carotid bruits, or masses Cardiac: RRR; no murmurs, rubs, or gallops,no edema.  Intact distal pulses bilaterally.  Respiratory:  clear to auscultation bilaterally, normal work of breathing GI: soft, nontender, nondistended, + BS MS: no deformity or atrophy  Skin: warm and dry, no rash Neuro:  Alert and Oriented x 3, Strength and sensation are intact Psych: euthymic mood, full affect  Wt Readings from Last 3 Encounters:  04/24/20 180 lb (81.6 kg)  03/31/20 178 lb (80.7 kg)  04/21/14 205 lb (93 kg)      Studies/Labs  Reviewed:   EKG:  EKG is ordered today.  The ekg ordered today demonstrates sinus tachycardia at 100bpm with no ST changes  Recent Labs: No results found for requested labs within last 8760 hours.   Lipid Panel No results found for: CHOL, TRIG, HDL, CHOLHDL, VLDL, LDLCALC, LDLDIRECT  Additional studies/ records that were reviewed today include:  2D echo 2015, stress echo 2015, heart monitor    ASSESSMENT:    1. Palpitations   2. SOB (shortness of breath)   3. Cor  triatriatum sinister      PLAN:  In order of problems listed above:  1. Palpitations -EKG in office today showed sinus tachycardia at 100bpm -she seems uncomfortable with the tachycardia despite her HR not really being high -I will get a TSH, CBC, CMET and a ddimer given hx of PE in the past -I will get a 2 week Ziopatch to assess for arrhythmias  2.  SOB -unclear etiology but notices with the palpitations -I will repeat a 2D echo given her hx of cor triatriatum -check ddimer with hx of PE  3.  Cor Triatriatum Sinister -noted on echo 2015 with nonobstructive m embrane   Medication Adjustments/Labs and Tests Ordered: Current medicines are reviewed at length with the patient today.  Concerns regarding medicines are outlined above.  Medication changes, Labs and Tests ordered today are listed in the Patient Instructions below.  Patient Instructions  Medication Instructions:  Your physician recommends that you continue on your current medications as directed. Please refer to the Current Medication list given to you today.  *If you need a refill on your cardiac medications before your next appointment, please call your pharmacy*   Lab Work: TODAY:  D-Dimer Come back for morning labs: TSH, CBC, CMET, LH, FSH, AM CORTISOL  If you have labs (blood work) drawn today and your tests are completely normal, you will receive your results only by: Marland Kitchen MyChart Message (if you have MyChart) OR . A paper copy in the  mail If you have any lab test that is abnormal or we need to change your treatment, we will call you to review the results.   Testing/Procedures: Your physician has requested that you have an echocardiogram. Echocardiography is a painless test that uses sound waves to create images of your heart. It provides your doctor with information about the size and shape of your heart and how well your heart's chambers and valves are working. This procedure takes approximately one hour. There are no restrictions for this procedure.  Your physician has recommended that you wear an event monitor. Event monitors are medical devices that record the heart's electrical activity. Doctors most often [place] these monitors to diagnose arrhythmias. Arrhythmias are problems with the speed or rhythm of the heartbeat. The monitor is a small, portable device. You can wear one while you do your normal daily activities. This is usually used to diagnose what is causing palpitations/syncope (passing out).    Follow-Up: At [facility], you and your health needs are our priority.  As part of our continuing mission to provide you with exceptional heart care, we have created designated Provider Care Teams.  These Care Teams include your primary Cardiologist (physician) and Advanced Practice Providers (APPs -  Physician Assistants and Nurse Practitioners) who all work together to provide you with the care you need, when you need it.  We recommend signing up for the patient portal called "MyChart".  Sign up information is provided on this After Visit Summary.  MyChart is used to connect with patients for Virtual Visits (Telemedicine).  Patients are able to view lab/test results, encounter notes, upcoming appointments, etc.  Non-urgent messages can be sent to your provider as well.   To learn more about what you can do with MyChart, go to [email or web address].    Your next appointment:   4 week(s)  The format for your next  appointment:   In Person  Provider:   Fransico Him, MD   Other Instructions ZIO XT- Long Term Monitor Instructions  Your physician has requested you wear your ZIO patch monitor_14_days.   This is a single patch monitor.  [employer] supplies one patch monitor per enrollment.  Additional stickers are not available.   Please do not apply patch if you will be having a Nuclear Stress Test, Echocardiogram, Cardiac CT, MRI, or Chest Xray during the time frame you would be wearing the monitor. The patch cannot be worn during these tests.  You cannot remove and re-apply the ZIO XT patch monitor.   Your ZIO patch monitor will be sent USPS Priority mail from [facility] directly to your home address. The monitor may also be mailed to a PO BOX if home delivery is not available.   It may take 3-5 days to receive your monitor after you have been enrolled.   Once you have received you monitor, please review enclosed instructions.  Your monitor has already been registered assigning a specific monitor serial # to you.   Applying the monitor   Shave hair from upper left chest.   Hold abrader disc by orange tab.  Rub abrader in 40 strokes over left upper chest as indicated in your monitor instructions.   Clean area with 4 enclosed alcohol pads .  Use all pads to assure are is cleaned thoroughly.  Let dry.   Apply patch as indicated in monitor instructions.  Patch will be place under collarbone on left side of chest with arrow pointing upward.   Rub patch adhesive wings for 2 minutes.Remove white label marked "1".  Remove white label marked "2".  Rub patch adhesive wings for 2 additional minutes.   While looking in a mirror, press and release button in center of patch.  A small green light will flash 3-4 times .  This will be your only indicator the monitor has been turned on.     Do not shower for the first 24 hours.  You may shower after the first 24 hours.   Press button if you feel a  symptom. You will hear a small click.  Record Date, Time and Symptom in the Patient Log Book.   When you are ready to remove patch, follow instructions on last 2 pages of Patient Log Book.  Stick patch monitor onto last page of Patient Log Book.   Place Patient Log Book in [place] box.  Use locking tab on box and tape box closed securely.  The [employer] and [employer] has [employer] on it.  Please place in mailbox as soon as possible.  Your physician should have your test results approximately 7 days after the monitor has been mailed back to [facility].   Call [place] at [phone] if you have questions regarding your ZIO XT patch monitor.  Call them immediately if you see an orange light blinking on your monitor.   If your monitor falls off in less than 4 days contact our Monitor department at [phone].  If your monitor becomes loose or falls off after 4 days call [employer] at [phone] for suggestions on securing your monitor.       Signed, Fransico Him, MD  04/26/2020 3:31 PM    [facility] [address], [place], [place]  [address] Phone: [phone]; Fax: [phone]

## 2020-04-26 ENCOUNTER — Encounter: Payer: Self-pay | Admitting: Cardiology

## 2020-04-26 DIAGNOSIS — Q242 Cor triatriatum: Secondary | ICD-10-CM

## 2020-04-26 HISTORY — DX: Cor triatriatum: Q24.2

## 2020-04-27 ENCOUNTER — Other Ambulatory Visit: Payer: Self-pay

## 2020-04-27 ENCOUNTER — Other Ambulatory Visit: Payer: 59 | Admitting: *Deleted

## 2020-04-27 DIAGNOSIS — R002 Palpitations: Secondary | ICD-10-CM | POA: Diagnosis not present

## 2020-04-27 DIAGNOSIS — H539 Unspecified visual disturbance: Secondary | ICD-10-CM

## 2020-04-27 DIAGNOSIS — R519 Headache, unspecified: Secondary | ICD-10-CM

## 2020-04-27 NOTE — Telephone Encounter (Signed)
LVM for pt to call office back or send mychart message back to me. Trying to follow up to see if appt tomorrow works for her.

## 2020-04-27 NOTE — Addendum Note (Signed)
Addended by: Wyvonnia Lora on: 04/27/2020 05:01 PM   Modules accepted: Orders

## 2020-04-27 NOTE — Telephone Encounter (Signed)
Called pt back. Advised Dr. Felecia Shelling reviewed ophthalmology note. He would like to order LP to measure pressure as this may be cause for worsening vision. Pt agreeable to plan. I placed order. Asked her to call back if she does not hear about getting scheduled in the next week or so.

## 2020-04-28 LAB — CBC
Hematocrit: 46.3 % (ref 34.0–46.6)
Hemoglobin: 16.1 g/dL — ABNORMAL HIGH (ref 11.1–15.9)
MCH: 33.3 pg — ABNORMAL HIGH (ref 26.6–33.0)
MCHC: 34.8 g/dL (ref 31.5–35.7)
MCV: 96 fL (ref 79–97)
Platelets: 169 10*3/uL (ref 150–450)
RBC: 4.83 x10E6/uL (ref 3.77–5.28)
RDW: 12.9 % (ref 11.7–15.4)
WBC: 5.6 10*3/uL (ref 3.4–10.8)

## 2020-04-28 LAB — COMPREHENSIVE METABOLIC PANEL
ALT: 9 IU/L (ref 0–32)
AST: 16 IU/L (ref 0–40)
Albumin/Globulin Ratio: 1.7 (ref 1.2–2.2)
Albumin: 4.5 g/dL (ref 3.8–4.8)
Alkaline Phosphatase: 40 IU/L — ABNORMAL LOW (ref 44–121)
BUN/Creatinine Ratio: 13 (ref 9–23)
BUN: 12 mg/dL (ref 6–20)
Bilirubin Total: 0.7 mg/dL (ref 0.0–1.2)
CO2: 23 mmol/L (ref 20–29)
Calcium: 9.5 mg/dL (ref 8.7–10.2)
Chloride: 108 mmol/L — ABNORMAL HIGH (ref 96–106)
Creatinine, Ser: 0.89 mg/dL (ref 0.57–1.00)
Globulin, Total: 2.7 g/dL (ref 1.5–4.5)
Glucose: 90 mg/dL (ref 65–99)
Potassium: 4.9 mmol/L (ref 3.5–5.2)
Sodium: 143 mmol/L (ref 134–144)
Total Protein: 7.2 g/dL (ref 6.0–8.5)
eGFR: 88 mL/min/{1.73_m2} (ref >59)

## 2020-04-28 LAB — LUTEINIZING HORMONE: LH: 12.5 m[IU]/mL

## 2020-04-28 LAB — FOLLICLE STIMULATING HORMONE: FSH: 7.1 m[IU]/mL

## 2020-04-28 LAB — TSH: TSH: 1.13 u[IU]/mL (ref 0.450–4.500)

## 2020-04-28 LAB — CORTISOL-AM, BLOOD: Cortisol - AM: 14 ug/dL (ref 6.2–19.4)

## 2020-04-29 MED FILL — TRETINOIN 0.05 % CREA: 0.05 | 30 days supply | Qty: 20 | Fill #3

## 2020-04-29 MED FILL — CONCERTA 54 MG TABLET ER: 54 | 30 days supply | Qty: 30 | Fill #0

## 2020-05-03 DIAGNOSIS — R002 Palpitations: Secondary | ICD-10-CM

## 2020-05-07 ENCOUNTER — Telehealth: Payer: Self-pay | Admitting: Neurology

## 2020-05-07 ENCOUNTER — Other Ambulatory Visit (HOSPITAL_COMMUNITY): Payer: Self-pay | Admitting: Gastroenterology

## 2020-05-07 DIAGNOSIS — R194 Change in bowel habit: Secondary | ICD-10-CM | POA: Diagnosis not present

## 2020-05-07 DIAGNOSIS — K921 Melena: Secondary | ICD-10-CM | POA: Diagnosis not present

## 2020-05-07 DIAGNOSIS — R1013 Epigastric pain: Secondary | ICD-10-CM | POA: Diagnosis not present

## 2020-05-07 MED FILL — OMEPRAZOLE 20 MG CAP: 20 | 30 days supply | Qty: 30 | Fill #0

## 2020-05-07 NOTE — Telephone Encounter (Signed)
[facility] called, Pt having a lumbar puncture with no labs. Want to verify if with out labs.   Contact info: [phone]

## 2020-05-07 NOTE — Telephone Encounter (Signed)
Called back. She wanted to know if pressure high, should they bring it back to normal. Advised per MD that no labs needed and yes, they should bring pressure back to normal if high. She verbalized understanding.

## 2020-05-08 ENCOUNTER — Other Ambulatory Visit: Payer: Self-pay

## 2020-05-08 ENCOUNTER — Ambulatory Visit
Admission: RE | Admit: 2020-05-08 | Discharge: 2020-05-08 | Disposition: A | Discharge status: Home / Self Care | Payer: 59 | Source: Ambulatory Visit | Attending: Neurology | Admitting: Neurology

## 2020-05-08 DIAGNOSIS — H539 Unspecified visual disturbance: Secondary | ICD-10-CM

## 2020-05-08 DIAGNOSIS — R519 Headache, unspecified: Secondary | ICD-10-CM | POA: Diagnosis not present

## 2020-05-08 DIAGNOSIS — H547 Unspecified visual loss: Secondary | ICD-10-CM | POA: Diagnosis not present

## 2020-05-08 NOTE — Discharge Instructions (Signed)

## 2020-05-12 ENCOUNTER — Other Ambulatory Visit: Payer: Self-pay | Admitting: *Deleted

## 2020-05-12 ENCOUNTER — Other Ambulatory Visit (HOSPITAL_COMMUNITY): Payer: Self-pay | Admitting: Neurology

## 2020-05-12 ENCOUNTER — Other Ambulatory Visit: Payer: Self-pay | Admitting: Neurology

## 2020-05-12 DIAGNOSIS — G971 Other reaction to spinal and lumbar puncture: Secondary | ICD-10-CM

## 2020-05-12 MED ORDER — TOPIRAMATE 50 MG PO TABS: ORAL_TABLET | ORAL | 3 refills | Status: DC

## 2020-05-12 NOTE — Telephone Encounter (Signed)
Per vo by Dr. Felecia Shelling, he will authorize order for blood patch. Placed in [place] and sent to [facility] as urgent for scheduling. I spoke to the patient and she is aware to expect a call.

## 2020-05-15 ENCOUNTER — Inpatient Hospital Stay: Admission: RE | Admit: 2020-05-15 | Payer: 59 | Source: Ambulatory Visit

## 2020-05-18 ENCOUNTER — Ambulatory Visit (HOSPITAL_COMMUNITY): Payer: 59 | Attending: Cardiovascular Disease

## 2020-05-18 ENCOUNTER — Other Ambulatory Visit: Payer: Self-pay

## 2020-05-18 DIAGNOSIS — R002 Palpitations: Secondary | ICD-10-CM | POA: Diagnosis not present

## 2020-05-18 LAB — ECHOCARDIOGRAM COMPLETE
Area-P 1/2: 3.88 cm2
S' Lateral: 2.2 cm

## 2020-05-25 DIAGNOSIS — R002 Palpitations: Secondary | ICD-10-CM | POA: Diagnosis not present

## 2020-06-02 ENCOUNTER — Other Ambulatory Visit (HOSPITAL_COMMUNITY): Payer: Self-pay

## 2020-06-02 ENCOUNTER — Ambulatory Visit: Payer: Managed Care, Other (non HMO) | Admitting: Cardiology

## 2020-06-02 MED FILL — Methylphenidate HCl Tab ER Osmotic Release (OSM) 54 MG: ORAL | 30 days supply | Qty: 30 | Fill #0 | Status: CN

## 2020-06-06 ENCOUNTER — Other Ambulatory Visit (HOSPITAL_COMMUNITY): Payer: Self-pay

## 2020-06-06 MED FILL — Methylphenidate HCl Tab ER Osmotic Release (OSM) 54 MG: ORAL | 30 days supply | Qty: 30 | Fill #0 | Status: AC

## 2020-06-08 ENCOUNTER — Other Ambulatory Visit (HOSPITAL_COMMUNITY): Payer: Self-pay

## 2020-06-10 ENCOUNTER — Other Ambulatory Visit (HOSPITAL_COMMUNITY): Payer: Self-pay

## 2020-06-23 NOTE — Telephone Encounter (Signed)
Called pt. Scheduled work in appt for tomorrow at 1:30pm w/ Dr. Felecia Shelling. Asked she check in by 1:00pm. She states topamax makes her feel loopy.

## 2020-06-24 ENCOUNTER — Other Ambulatory Visit (HOSPITAL_COMMUNITY): Payer: Self-pay

## 2020-06-24 ENCOUNTER — Ambulatory Visit: Payer: Managed Care, Other (non HMO) | Admitting: Neurology

## 2020-06-24 ENCOUNTER — Encounter: Payer: Self-pay | Admitting: Neurology

## 2020-06-24 VITALS — BP 110/81 | HR 92 | Ht 65.0 in | Wt 180.5 lb

## 2020-06-24 DIAGNOSIS — G932 Benign intracranial hypertension: Secondary | ICD-10-CM | POA: Diagnosis not present

## 2020-06-24 DIAGNOSIS — R2990 Unspecified symptoms and signs involving the nervous system: Secondary | ICD-10-CM | POA: Diagnosis not present

## 2020-06-24 DIAGNOSIS — M5431 Sciatica, right side: Secondary | ICD-10-CM | POA: Diagnosis not present

## 2020-06-24 DIAGNOSIS — U099 Post covid-19 condition, unspecified: Secondary | ICD-10-CM | POA: Diagnosis not present

## 2020-06-24 MED ORDER — ACETAZOLAMIDE 250 MG PO TABS
250.0000 mg | ORAL_TABLET | Freq: Two times a day (BID) | ORAL | 11 refills | Status: AC
  Filled 2020-06-24: qty 60, 30d supply, fill #0

## 2020-06-24 NOTE — Progress Notes (Signed)
[facility]  PATIENT: [name] DOB: November 16, 1985  REFERRING DOCTOR OR PCP: Yaakov Guthrie, MD SOURCE: Patient, notes from primary care  _________________________________   HISTORICAL  CHIEF COMPLAINT:  Chief Complaint  Patient presents with  . Follow-up    RM 12, alone. Last seen 03/31/2020. Having ongoing headache's, double vision/blurry. Stopped topamax, had SE((made her feel drunk). She tried taking at bedtime/on the weekends but could not tolerate. Did not take gabapentin, worried about addiction.     HISTORY OF PRESENT ILLNESS:  Nil Bolser is a 35 y.o. woman with cognitive issues, hand twitching and right sciatica starting after a COVID-19 infection.  Update 06/24/2020: She continues to experience HA.  MRI showed an empty sella and LP showed elevated pressure (24 cm).   She had more HAs after the LP with a positional quality and a blood patch was set up.  As she improved the next couple days she held off and HA's are now better than post-LP but just slightly better than before the LP.   She has pain mostly in the right vertex region.   Pain is a dull tapping.   She notes some reduced vision with tunnel vision.  She saw Dr. Sabra Heck.  He told he that the optic nerves looked fine.     We had placed her on topiramate for the elevated pressures.     She also notes pain in the right buttock that is worse when she sits on left side.  It radiates into the right leg.  MRI of the lumbar spine did not show any nerve compression though she does have some degenerative changes at L4-L5 and L5-S1.    The pain radiates down the right leg into the foot with a sharp quality.    Pain comes and goes.  This weekend was more severe but the last two days has been milder.    She does not note it is worse if she sits on the right buttock.  I have prescribed gabapentin but she was concerned about starting the medication so has not done so yet.  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  MRI of the brain 04/08/2020 showed an empty sella.  Brain parenchyma was normal.  MRI of the lumbar spine 04/08/2020 showed mild disc degenerative changes at L3-L4 and L4-L5 and mild facet hypertrophy at L4-L5 but no nerve root compression.  REVIEW OF SYSTEMS: Constitutional: No fevers, chills, sweats, or change in appetite Eyes: No visual changes, double vision, eye pain Ear, nose and throat: No hearing loss, ear pain, nasal congestion, sore throat Cardiovascular: No chest pain, palpitations.  Congenital heart issue diagnosed after an episode of syncope. Respiratory: No shortness of breath at rest or with exertion.   No wheezes GastrointestinaI: No nausea, vomiting, diarrhea, abdominal pain, fecal incontinence Genitourinary: No dysuria, urinary retention or frequency.  No nocturia. Musculoskeletal: No neck pain, back pain Integumentary: No rash, pruritus, skin lesions Neurological: as above Psychiatric: No depression at this time.  No anxiety.  ADHD. Endocrine: No palpitations, diaphoresis, change in appetite, change in weigh or increased thirst Hematologic/Lymphatic: No anemia, purpura, petechiae. Allergic/Immunologic: No itchy/runny eyes, nasal congestion, recent allergic reactions, rashes  ALLERGIES: No Known Allergies  HOME MEDICATIONS:  Current Outpatient Medications:  .  acetaZOLAMIDE (DIAMOX) 250 MG tablet, Take 1 tablet (250 mg total) by mouth 2 (two) times daily., Disp: 60 tablet, Rfl: 11 .  albuterol (VENTOLIN HFA) 108 (90 Base) MCG/ACT inhaler,  Inhale 2 puffs into the lungs every 6 (six) hours as needed for wheezing or shortness of breath., Disp: 8 g, Rfl: 2 .  albuterol (VENTOLIN HFA) 108 (90 Base) MCG/ACT inhaler, INHALE 2 PUFFS BY MOUTH EVERY 6 HOURS AS NEEDED FOR WHEEZING OR SHORTNESS OF BREATH, Disp: 18 g, Rfl: 2 .  Biotin 1000 MCG tablet, Take 1,000 mcg by mouth daily., Disp: , Rfl:  .  Cholecalciferol (VITAMIN  D3) 3000 UNITS TABS, Take 1,000 Units by mouth daily., Disp: , Rfl:  .  fluticasone (FLONASE) 50 MCG/ACT nasal spray, Place 1 spray into both nostrils as needed for allergies or rhinitis., Disp: , Rfl:  .  ipratropium (ATROVENT) 0.06 % nasal spray, Place 2 sprays into both nostrils 4 (four) times daily., Disp: 15 mL, Rfl: 12 .  Levonorgestrel 13.5 MG IUD, 13.5 mg by Intrauterine route. Replace every 3 years. Skyla brand, Disp: , Rfl:  .  methylphenidate 54 MG PO CR tablet, TAKE 1 TABLET BY MOUTH ONCE DAILY, Disp: 30 tablet, Rfl: 0 .  Multiple Vitamins-Calcium (ONE-A-DAY WOMENS FORMULA PO), Take 1 tablet by mouth daily., Disp: , Rfl:  .  naproxen sodium (ANAPROX) 220 MG tablet, Take 220-440 mg by mouth every 12 (twelve) hours as needed (for pain)., Disp: , Rfl:  .  omeprazole (PRILOSEC) 20 MG capsule, TAKE 1 CAPSULE BY MOUTH 30 MINUTES BEFORE MORNING MEAL (Patient taking differently: as needed.), Disp: 30 capsule, Rfl: 0  PAST MEDICAL HISTORY: Past Medical History:  Diagnosis Date  . Acid reflux   . Chest pain   . 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 Multinodular goiter    2 nodules bx previously,seen by endo in the past U/S 6/12, 2 right thyroid nodules are stable, unchanged from prior  . PE (pulmonary embolism) 2009   Unknown source, possibly due to contraception,car trip Dr Tessa Lerner labs all neg    PAST SURGICAL HISTORY: Past Surgical History:  Procedure Laterality Date  . WISDOM TOOTH EXTRACTION      FAMILY HISTORY: Family History  Problem Relation Age of Onset  . Hypertension Mother   . Colon cancer Mother   . Deep vein thrombosis Father   . Diabetes Father   . Hypertension Father   . Colon cancer Maternal Grandmother   . Ovarian cancer Maternal Grandmother   . Diabetes Paternal Grandmother     SOCIAL HISTORY:  Social History   Socioeconomic History  . Marital status: Married    Spouse name: Not on file  . Number of children: Not on file  . Years of  education: Not on file  . Highest education level: Not on file  Occupational History  . Occupation: Emergency planning/management officer  Tobacco Use  . Smoking status: Never Smoker  . Smokeless tobacco: Never Used  Vaping Use  . Vaping Use: Never used  Substance and Sexual Activity  . Alcohol use: Yes    Alcohol/week: 0.0 standard drinks    Comment: rare  . Drug use: No  . Sexual activity: Yes    Birth control/protection: I.U.D.  Other Topics Concern  . Not on file  Social History Narrative   Lives with husband   Right Handed   Drinks 1-2 cup daily   Social Determinants of Health   Financial Resource Strain: Not on file  Food Insecurity: Not on file  Transportation Needs: Not on file  Physical Activity: Not on file  Stress: Not on file  Social Connections: Not on file  Intimate Partner Violence: Not on file  PHYSICAL EXAM  Vitals:   06/24/20 1310  BP: 110/81  Pulse: 92  SpO2: 98%  Weight: 180 lb 8 oz (81.9 kg)  Height: 5\' 5"  (1.651 m)    Body mass index is 30.04 kg/m.   General: The patient is well-developed and well-nourished and in no acute distress  HEENT:  Head is [place]/AT.  Sclera are anicteric.  Funduscopic exam shows normal optic discs and retinal vessels.  No papilledema  Skin: Extremities are without rash or  edema.  Musculoskeletal: She has moderate tenderness over the right piriformis muscle.  No tenderness over the trochanteric bursa and only mild tenderness over the paraspinal muscles  Neurologic Exam  Mental status: The patient is alert and oriented x 3 at the time of the examination. The patient has apparent normal recent and remote memory.  Focus/attention seemed reduced during the interaction.Marland Kitchen   Speech is normal though she paused a few times to come up with the right word.  Cranial nerves: Extraocular movements are full. Pupils are equal, round, and reactive to light and accomodation.  Facial strength is normal     No obvious hearing deficits are  noted.  Motor: She has a low amplitude high-frequency tremor in the right hand.  Muscle bulk is normal.   Tone is normal. Strength is  5 / 5 in all 4 extremities.   Sensory: Sensory testing is intact to pinprick, soft touch and vibration sensation in arms and left foot but reduced touch and vibration in entire right foot  Coordination: Cerebellar testing reveals good finger-nose-finger and heel-to-shin bilaterally.  Gait and station: Station is normal.   Gait is arthritic.  Due to pain, tandem gait is mildly wide..  Romberg is negative.   Reflexes: Deep tendon reflexes are symmetric and normal bilaterally.       DIAGNOSTIC DATA (LABS, IMAGING, TESTING) - I reviewed patient records, labs, notes, testing and imaging myself where available.  Lab Results  Component Value Date   WBC 5.6 04/27/2020   HGB 16.1 (H) 04/27/2020   HCT 46.3 04/27/2020   MCV 96 04/27/2020   PLT 169 04/27/2020      Component Value Date/Time   NA 143 04/27/2020 0824   K 4.9 04/27/2020 0824   CL 108 (H) 04/27/2020 0824   CO2 23 04/27/2020 0824   GLUCOSE 90 04/27/2020 0824   GLUCOSE 96 05/15/2008 1334   BUN 12 04/27/2020 0824   CREATININE 0.89 04/27/2020 0824   CALCIUM 9.5 04/27/2020 0824   PROT 7.2 04/27/2020 0824   ALBUMIN 4.5 04/27/2020 0824   AST 16 04/27/2020 0824   ALT 9 04/27/2020 0824   ALKPHOS 40 (L) 04/27/2020 0824   BILITOT 0.7 04/27/2020 0824   GFRNONAA >90 07/25/2013 1443   GFRAA >90 07/25/2013 1443       ASSESSMENT AND PLAN  Post-COVID chronic neurologic symptoms  Sciatica of right side  Idiopathic intracranial hypertension  1.  Although she does not have papilledema, CSF had elevated pressure on lumbar puncture.  Additionally, she has an empty sella on MRI.  Therefore, I would like her to start acetazolamide 250 mg twice daily and we may titrate up from there. 2.   Pain in the buttock and right leg could be due to a piriformis syndrome.  We discussed getting a trigger point  injection but she would like to hold off at this time.  She will try the gabapentin. 3.   Return in 3 to 4 months or sooner for new or worsening  neurologic symptoms.  Raylin Winer A. Felecia Shelling, MD, [facility] [id number], [id number] PM Certified in Neurology, Clinical Neurophysiology, Sleep Medicine and Neuroimaging  [facility] [address] [place], [place] [address] [phone]

## 2020-06-26 ENCOUNTER — Other Ambulatory Visit (HOSPITAL_COMMUNITY): Payer: Self-pay

## 2020-06-26 MED ORDER — FLUOROURACIL 5 % EX CREA
TOPICAL_CREAM | CUTANEOUS | 0 refills | Status: DC
  Filled 2020-06-26: qty 40, 30d supply, fill #0

## 2020-06-27 ENCOUNTER — Other Ambulatory Visit (HOSPITAL_COMMUNITY): Payer: Self-pay

## 2020-06-27 MED ORDER — PEG 3350-KCL-NA BICARB-NACL 420 G PO SOLR
ORAL | 0 refills | Status: DC
  Filled 2020-06-27: qty 4000, 1d supply, fill #0

## 2020-06-30 ENCOUNTER — Telehealth: Payer: Self-pay | Admitting: *Deleted

## 2020-06-30 ENCOUNTER — Telehealth: Payer: Self-pay | Admitting: Cardiology

## 2020-06-30 NOTE — Telephone Encounter (Signed)
   [facility] Pre-operative Risk Assessment    Patient Name: [name]  DOB: 07/13/1985  MRN: [id number]   HEARTCARE STAFF: - Please ensure there is not already an duplicate clearance open for this procedure. - Under Visit Info/Reason for Call, type in Other and utilize the format Clearance MM/DD/YY or Clearance TBD. Do not use dashes or single digits. - If request is for dental extraction, please clarify the # of teeth to be extracted.  Request for surgical clearance:  1. What type of surgery is being performed? COLONOSCOPY (BLOOD IN STOOL)   2. When is this surgery scheduled? 07/03/20   3. What type of clearance is required (medical clearance vs. Pharmacy clearance to hold med vs. Both)? MEDICAL  4. Are there any medications that need to be held prior to surgery and how long? NONE LISTED   5. Practice name and name of physician performing surgery? [facility]; DR. MAGOD   6. What is the office phone number? [phone]   7.   What is the office fax number? [phone]  8.   Anesthesia type (None, local, MAC, general) ? NONE LISTED (PROPOFOL) ?   Julaine Hua 06/30/2020, 1:20 PM  _________________________________________________________________   (provider comments below)

## 2020-06-30 NOTE — Telephone Encounter (Signed)
Pt returning call from earlier today to [facility]. Please advise

## 2020-06-30 NOTE — Telephone Encounter (Signed)
Left VM

## 2020-07-02 NOTE — Telephone Encounter (Signed)
Freda Munro from [facility] calling to follow up on clearance. She states they need an update today, because the patient starts her prep for the procedure today.

## 2020-07-02 NOTE — Telephone Encounter (Signed)
   Name: [name]  DOB: June 08, 1985  MRN: [id number]   Primary Cardiologist: Fransico Him, MD  Chart reviewed as part of pre-operative protocol coverage. Patient was contacted 07/02/2020 in reference to pre-operative risk assessment for pending surgery as outlined below.  [name] was last seen on 04/24/20 by Dr. Radford Pax.  Since that day, [name] has done fine from a cardiac standpoint. She can complete 4 METs without anginal complaints.  Therefore, based on ACC/AHA guidelines, the patient would be at acceptable risk for the planned procedure without further cardiovascular testing.   The patient was advised that if she develops new symptoms prior to surgery to contact our office to arrange for a follow-up visit, and she verbalized understanding.  I will route this recommendation to the requesting party via Epic fax function and remove from pre-op pool. Please call with questions.  Abigail Butts, PA-C 07/02/2020, 9:40 AM

## 2020-07-02 NOTE — Telephone Encounter (Signed)
   Left a voicemail for patient to call back today in order to not delay her procedure. Will await call back for ongoing preop assessment.   Discussed with Dr. Radford Pax, given unobstructive nature of her cor triatriatum, there is no increased surgical risk from a cardiac standpoint. As long as patient is not having anginal complaints, do not anticipate need for further work-up.   Abigail Butts, PA-C 07/02/20; 9:01 AM

## 2020-07-03 HISTORY — PX: COLONOSCOPY: SHX174

## 2020-07-09 ENCOUNTER — Other Ambulatory Visit (HOSPITAL_COMMUNITY): Payer: Self-pay

## 2020-07-09 ENCOUNTER — Other Ambulatory Visit (HOSPITAL_COMMUNITY): Payer: Self-pay | Admitting: Internal Medicine

## 2020-07-09 MED ORDER — CLINDAMYCIN PHOS-BENZOYL PEROX 1-5 % EX GEL
CUTANEOUS | 2 refills | Status: AC
  Filled 2020-07-09: qty 50, 30d supply, fill #0
  Filled 2020-09-17: qty 50, 30d supply, fill #1
  Filled 2021-01-20: qty 50, 30d supply, fill #2

## 2020-07-09 MED ORDER — TRETINOIN 0.05 % EX CREA
TOPICAL_CREAM | CUTANEOUS | 2 refills | Status: DC
  Filled 2020-07-09: qty 20, 30d supply, fill #0
  Filled 2020-09-17: qty 20, 30d supply, fill #1
  Filled 2020-12-15: qty 20, 30d supply, fill #2

## 2020-07-10 ENCOUNTER — Other Ambulatory Visit (HOSPITAL_COMMUNITY): Payer: Self-pay

## 2020-07-10 MED ORDER — METHYLPHENIDATE HCL ER (OSM) 54 MG PO TBCR
54.0000 mg | EXTENDED_RELEASE_TABLET | Freq: Every day | ORAL | 0 refills | Status: AC
  Filled 2020-07-10: qty 30, 30d supply, fill #0

## 2020-07-11 ENCOUNTER — Other Ambulatory Visit (HOSPITAL_COMMUNITY): Payer: Self-pay

## 2020-07-13 ENCOUNTER — Telehealth: Payer: Self-pay | Admitting: Cardiology

## 2020-07-13 NOTE — Telephone Encounter (Signed)
Received disability forms. Called patient to inform her of the $29 fee and authorization we will need to complete the form. AO 07/13/20

## 2020-07-22 ENCOUNTER — Ambulatory Visit: Payer: Managed Care, Other (non HMO) | Admitting: Neurology

## 2020-07-22 ENCOUNTER — Encounter: Payer: Self-pay | Admitting: Neurology

## 2020-07-22 ENCOUNTER — Other Ambulatory Visit (HOSPITAL_COMMUNITY): Payer: Self-pay

## 2020-07-22 VITALS — BP 131/82 | HR 101 | Ht 65.0 in | Wt 175.5 lb

## 2020-07-22 DIAGNOSIS — R2990 Unspecified symptoms and signs involving the nervous system: Secondary | ICD-10-CM

## 2020-07-22 DIAGNOSIS — G932 Benign intracranial hypertension: Secondary | ICD-10-CM

## 2020-07-22 DIAGNOSIS — M5431 Sciatica, right side: Secondary | ICD-10-CM | POA: Diagnosis not present

## 2020-07-22 DIAGNOSIS — H539 Unspecified visual disturbance: Secondary | ICD-10-CM

## 2020-07-22 DIAGNOSIS — G43709 Chronic migraine without aura, not intractable, without status migrainosus: Secondary | ICD-10-CM

## 2020-07-22 DIAGNOSIS — U099 Post covid-19 condition, unspecified: Secondary | ICD-10-CM

## 2020-07-22 MED ORDER — FUROSEMIDE 20 MG PO TABS
20.0000 mg | ORAL_TABLET | Freq: Every day | ORAL | 3 refills | Status: AC
  Filled 2020-07-22: qty 30, 30d supply, fill #0

## 2020-07-22 MED ORDER — POTASSIUM CHLORIDE CRYS ER 20 MEQ PO TBCR
20.0000 meq | EXTENDED_RELEASE_TABLET | Freq: Two times a day (BID) | ORAL | 3 refills | Status: AC
  Filled 2020-07-22: qty 60, 30d supply, fill #0

## 2020-07-22 NOTE — Progress Notes (Signed)
[facility]  PATIENT: [name] DOB: 07/27/1985  REFERRING DOCTOR OR PCP: Rachel Guthrie, MD SOURCE: Patient, notes from primary care  _________________________________   HISTORICAL  CHIEF COMPLAINT:  Chief Complaint  Patient presents with  . Follow-up    RM 12, alone. Last seen 06/24/20. Here to discuss ongoing headaches, ears ringing, has tingling in hands. Wants to get LTD forms completed while here.    HISTORY OF PRESENT ILLNESS:  [name] is a 35 y.o. woman with headaches, visual changes another neurologic symptoms who has elevated intracranial pressure and an empty sella  Update 07/22/2020: At the last visit, acetazolamide was added as the CSF had mildly elevated pressure (24 cm) and she had an empty sella on MRI (though no papilledema so diagnosis is not definite).   She is noting a tingling in her hands, fingers and toes.   She notes it more after she stands up.     Carbonated beverages taste bad.    Headaches have not changed.      She has a ringling in her ears like she had been at a loud concert.   Gabapentin was added but she does not note a difference.    HA is present when she wakes up and still in bed.   It fluctuates some throughout the day.  She has some     She continues to experience HA despite the LP and Diamox.  She had more HAs after the LP with a positional quality and a blood patch was set up.  As she improved the next couple days she held off and     She has pain mostly in the right vertex region.   Pain is a dull tapping.   Vision is the same - sometimes fuzzy and mild peripheral changes.      She has right buttock, worse when she sits on left side that radiates into the right leg.  MRI of the lumbar spine did not show any nerve compression though she does have some degenerative changes at L4-L5 and L5-S1.  Gabapentin has helped slightly.    She has a congenital heart issue (extra atrium)diagnosed after an episode of  syncope.  Medications tried for headaches: Antiepileptics:   Topiramate, gabapentin NSAIDs:  OTC Other:  Acetazolamide.     IMAGING: MRI of the brain 04/08/2020 showed an empty sella.  Brain parenchyma was normal.  MRI of the lumbar spine 04/08/2020 showed mild disc degenerative changes at L3-L4 and L4-L5 and mild facet hypertrophy at L4-L5 but no nerve root compression.  REVIEW OF SYSTEMS: Constitutional: No fevers, chills, sweats, or change in appetite Eyes: No visual changes, double vision, eye pain Ear, nose and throat: No hearing loss, ear pain, nasal congestion, sore throat Cardiovascular: No chest pain, palpitations.  Congenital heart issue diagnosed after an episode of syncope. Respiratory: No shortness of breath at rest or with exertion.   No wheezes GastrointestinaI: No nausea, vomiting, diarrhea, abdominal pain, fecal incontinence Genitourinary: No dysuria, urinary retention or frequency.  No nocturia. Musculoskeletal: No neck pain, back pain Integumentary: No rash, pruritus, skin lesions Neurological: as above Psychiatric: No depression at this time.  No anxiety.  ADHD. Endocrine: No palpitations, diaphoresis, change in appetite, change in weigh or increased thirst Hematologic/Lymphatic: No anemia, purpura, petechiae. Allergic/Immunologic: No itchy/runny eyes, nasal congestion, recent allergic reactions, rashes  ALLERGIES: No Known Allergies  HOME MEDICATIONS:  Current Outpatient Medications:  .  acetaZOLAMIDE (DIAMOX) 250 MG tablet, Take 1 tablet (250  mg total) by mouth 2 (two) times daily., Disp: 60 tablet, Rfl: 11 .  albuterol (VENTOLIN HFA) 108 (90 Base) MCG/ACT inhaler, Inhale 2 puffs into the lungs every 6 (six) hours as needed for wheezing or shortness of breath., Disp: 8 g, Rfl: 2 .  Biotin 1000 MCG tablet, Take 1,000 mcg by mouth daily., Disp: , Rfl:  .  Cholecalciferol (VITAMIN D3) 3000 UNITS TABS, Take 1,000 Units by mouth daily., Disp: , Rfl:  .   clindamycin-benzoyl peroxide (BENZACLIN) gel, Apply to affected areas of the skin in the morning, Disp: 50 g, Rfl: 2 .  fluorouracil (EFUDEX) 5 % cream, Apply a thin layer to affected area every other night as directed, Disp: 40 g, Rfl: 0 .  fluticasone (FLONASE) 50 MCG/ACT nasal spray, Place 1 spray into both nostrils as needed for allergies or rhinitis., Disp: , Rfl:  .  furosemide (LASIX) 20 MG tablet, Take 1 tablet (20 mg total) by mouth daily., Disp: 90 tablet, Rfl: 3 .  ipratropium (ATROVENT) 0.06 % nasal spray, Place 2 sprays into both nostrils 4 (four) times daily. (Patient taking differently: Place 2 sprays into both nostrils as needed.), Disp: 15 mL, Rfl: 12 .  Levonorgestrel 13.5 MG IUD, 13.5 mg by Intrauterine route. Replace every 3 years. Skyla brand, Disp: , Rfl:  .  methylphenidate 54 MG PO CR tablet, Take 1 tablet (54 mg total) by mouth daily., Disp: 30 tablet, Rfl: 0 .  Multiple Vitamins-Calcium (ONE-A-DAY WOMENS FORMULA PO), Take 1 tablet by mouth daily., Disp: , Rfl:  .  naproxen sodium (ANAPROX) 220 MG tablet, Take 220-440 mg by mouth every 12 (twelve) hours as needed (for pain)., Disp: , Rfl:  .  omeprazole (PRILOSEC) 20 MG capsule, TAKE 1 CAPSULE BY MOUTH 30 MINUTES BEFORE MORNING MEAL (Patient taking differently: as needed.), Disp: 30 capsule, Rfl: 0 .  potassium chloride SA (KLOR-CON) 20 MEQ tablet, Take 1 tablet (20 mEq total) by mouth 2 (two) times daily., Disp: 90 tablet, Rfl: 3 .  tretinoin (RETIN-A) 0.05 % cream, Apply 1 application on the skin nightly; Use a few nights a week and increase to nightly as tolerated., Disp: 20 g, Rfl: 2  PAST MEDICAL HISTORY: Past Medical History:  Diagnosis Date  . Acid reflux   . Chest pain   . 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 Multinodular goiter    2 nodules bx previously,seen by endo in the past U/S 6/12, 2 right thyroid nodules are stable, unchanged from prior  . PE (pulmonary embolism) 2009   Unknown source, possibly due to  contraception,car trip Dr [name] labs all neg    PAST SURGICAL HISTORY: Past Surgical History:  Procedure Laterality Date  . COLONOSCOPY  07/03/2020  . WISDOM TOOTH EXTRACTION      FAMILY HISTORY: Family History  Problem Relation Age of Onset  . Hypertension Mother   . Colon cancer Mother   . Deep vein thrombosis Father   . Diabetes Father   . Hypertension Father   . Colon cancer Maternal Grandmother   . Ovarian cancer Maternal Grandmother   . Diabetes Paternal Grandmother     SOCIAL HISTORY:  Social History   Socioeconomic History  . Marital status: Married    Spouse name: Not on file  . Number of children: Not on file  . Years of education: Not on file  . Highest education level: Not on file  Occupational History  . Occupation: Emergency planning/management officer  Tobacco Use  . Smoking status:  Never Smoker  . Smokeless tobacco: Never Used  Vaping Use  . Vaping Use: Never used  Substance and Sexual Activity  . Alcohol use: Yes    Alcohol/week: 0.0 standard drinks    Comment: rare  . Drug use: No  . Sexual activity: Yes    Birth control/protection: I.U.D.  Other Topics Concern  . Not on file  Social History Narrative   Lives with husband   Right Handed   Drinks 1-2 cup daily   Social Determinants of Health   Financial Resource Strain: Not on file  Food Insecurity: Not on file  Transportation Needs: Not on file  Physical Activity: Not on file  Stress: Not on file  Social Connections: Not on file  Intimate Partner Violence: Not on file     PHYSICAL EXAM  Vitals:   07/22/20 0821  BP: 131/82  Pulse: (!) 101  SpO2: 98%  Weight: 175 lb 8 oz (79.6 kg)  Height: 5\' 5"  (1.651 m)    Body mass index is 29.2 kg/m.   General: The patient is well-developed and well-nourished and in no acute distress  HEENT:  Head is [place]/AT.  Sclera are anicteric.  Funduscopic exam shows normal optic discs and retinal vessels.  No papilledema  Skin: Extremities are  without rash or  edema.  Musculoskeletal: She has moderate tenderness over the right piriformis muscle.  No tenderness over the trochanteric bursa and only mild tenderness over the paraspinal muscles  Neurologic Exam  Mental status: The patient is alert and oriented x 3 at the time of the examination. The patient has apparent normal recent and remote memory.  Focus/attention seemed reduced during the interaction.Marland Kitchen   Speech is normal though she paused a few times to come up with the right word.  Cranial nerves: Extraocular movements are full. Pupils are equal, round, and reactive to light and accomodation.  Facial strength is normal     No obvious hearing deficits are noted.  Motor:   Muscle bulk is normal.   Tone is normal.  Strength was 5/5.    Ssensory: Sensory testing is intact to pinprick, soft touch and vibration sensation in arms and left foot but reduced touch and vibration in entire right foot  Coordination: Cerebellar testing reveals good finger-nose-finger and heel-to-shin bilaterally.  Gait and station: Station is normal.  Gait is arthritic.  Tandem gait is mildly wide.  Romberg is negative. Reflexes: Deep tendon reflexes are symmetric and normal bilaterally.       DIAGNOSTIC DATA (LABS, IMAGING, TESTING) - I reviewed patient records, labs, notes, testing and imaging myself where available.  Lab Results  Component Value Date   WBC 5.6 04/27/2020   HGB 16.1 (H) 04/27/2020   HCT 46.3 04/27/2020   MCV 96 04/27/2020   PLT 169 04/27/2020      Component Value Date/Time   NA 143 04/27/2020 0824   K 4.9 04/27/2020 0824   CL 108 (H) 04/27/2020 0824   CO2 23 04/27/2020 0824   GLUCOSE 90 04/27/2020 0824   GLUCOSE 96 05/15/2008 1334   BUN 12 04/27/2020 0824   CREATININE 0.89 04/27/2020 0824   CALCIUM 9.5 04/27/2020 0824   PROT 7.2 04/27/2020 0824   ALBUMIN 4.5 04/27/2020 0824   AST 16 04/27/2020 0824   ALT 9 04/27/2020 0824   ALKPHOS 40 (L) 04/27/2020 0824   BILITOT 0.7  04/27/2020 0824   GFRNONAA >90 07/25/2013 1443   GFRAA >90 07/25/2013 1443       ASSESSMENT  AND PLAN  Idiopathic intracranial hypertension  Sciatica of right side  Post-COVID chronic neurologic symptoms  Vision changes  Chronic migraine without aura without status migrainosus, not intractable  1.  Although she does not have papilledema, CSF had elevated pressure on lumbar puncture.  Additionally, she has an empty sella on MRI.  She cannot tolerate Diamox so will switch to furosemide (and potassium). 2. Trial of an anti-CGRP ([employer] Lot [id number]; 09/2021).   We will prescribe if benefit.   3. Return in 3 to 4 months or sooner for new or worsening neurologic symptoms.  Delmo Matty A. Felecia Shelling, MD, [facility] 1/00/7121, 9:75 AM Certified in Neurology, Clinical Neurophysiology, Sleep Medicine and Neuroimaging  [facility] [address] [place], [place] [address] [phone]

## 2020-07-23 DIAGNOSIS — Z0289 Encounter for other administrative examinations: Secondary | ICD-10-CM

## 2020-07-30 ENCOUNTER — Other Ambulatory Visit (HOSPITAL_COMMUNITY): Payer: Self-pay

## 2020-08-03 ENCOUNTER — Telehealth: Payer: Self-pay | Admitting: *Deleted

## 2020-08-03 NOTE — Telephone Encounter (Signed)
Pt [place] faxed on 08/03/20.

## 2020-08-12 ENCOUNTER — Other Ambulatory Visit (HOSPITAL_COMMUNITY): Payer: Self-pay

## 2020-08-13 ENCOUNTER — Other Ambulatory Visit (HOSPITAL_COMMUNITY): Payer: Self-pay

## 2020-08-13 MED ORDER — METHYLPHENIDATE HCL ER (OSM) 54 MG PO TBCR
EXTENDED_RELEASE_TABLET | ORAL | 0 refills | Status: DC
  Filled 2020-08-13: qty 30, 30d supply, fill #0

## 2020-09-01 ENCOUNTER — Telehealth: Payer: Self-pay | Admitting: Neurology

## 2020-09-01 ENCOUNTER — Ambulatory Visit: Payer: Self-pay | Admitting: Neurology

## 2020-09-01 ENCOUNTER — Ambulatory Visit: Payer: Managed Care, Other (non HMO) | Admitting: Neurology

## 2020-09-01 NOTE — Telephone Encounter (Signed)
Pt cancelled appt, not feeling well, my nephew tested positive for Covid, may have been exposed.

## 2020-09-15 ENCOUNTER — Other Ambulatory Visit (HOSPITAL_COMMUNITY): Payer: Self-pay

## 2020-09-15 MED ORDER — METHYLPHENIDATE HCL ER (OSM) 54 MG PO TBCR
EXTENDED_RELEASE_TABLET | ORAL | 0 refills | Status: AC
  Filled 2020-09-15: qty 30, 30d supply, fill #0

## 2020-09-16 ENCOUNTER — Other Ambulatory Visit (HOSPITAL_COMMUNITY): Payer: Self-pay

## 2020-09-17 ENCOUNTER — Other Ambulatory Visit (HOSPITAL_COMMUNITY): Payer: Self-pay

## 2020-09-18 ENCOUNTER — Other Ambulatory Visit (HOSPITAL_COMMUNITY): Payer: Self-pay

## 2020-09-18 MED ORDER — FLUOROURACIL 5 % EX CREA
TOPICAL_CREAM | CUTANEOUS | 0 refills | Status: AC
  Filled 2020-09-18: qty 40, 30d supply, fill #0

## 2020-10-20 ENCOUNTER — Other Ambulatory Visit (HOSPITAL_COMMUNITY): Payer: Self-pay

## 2020-10-20 MED ORDER — METHYLPHENIDATE HCL ER (OSM) 54 MG PO TBCR
EXTENDED_RELEASE_TABLET | ORAL | 0 refills | Status: DC
  Filled 2020-10-20: qty 30, 30d supply, fill #0

## 2020-11-18 ENCOUNTER — Other Ambulatory Visit (HOSPITAL_COMMUNITY): Payer: Self-pay

## 2020-11-19 ENCOUNTER — Other Ambulatory Visit (HOSPITAL_COMMUNITY): Payer: Self-pay

## 2020-11-19 MED ORDER — METHYLPHENIDATE HCL ER (OSM) 54 MG PO TBCR
EXTENDED_RELEASE_TABLET | Freq: Every day | ORAL | 0 refills | Status: AC
  Filled 2020-11-19: qty 30, 30d supply, fill #0

## 2020-12-16 ENCOUNTER — Other Ambulatory Visit (HOSPITAL_COMMUNITY): Payer: Self-pay

## 2020-12-17 ENCOUNTER — Other Ambulatory Visit (HOSPITAL_COMMUNITY): Payer: Self-pay

## 2020-12-21 ENCOUNTER — Other Ambulatory Visit (HOSPITAL_COMMUNITY): Payer: Self-pay

## 2020-12-21 MED ORDER — METHYLPHENIDATE HCL ER (OSM) 54 MG PO TBCR
EXTENDED_RELEASE_TABLET | ORAL | 0 refills | Status: AC
  Filled 2020-12-21: qty 30, 30d supply, fill #0

## 2020-12-22 ENCOUNTER — Other Ambulatory Visit (HOSPITAL_COMMUNITY): Payer: Self-pay

## 2021-01-18 ENCOUNTER — Other Ambulatory Visit (HOSPITAL_COMMUNITY): Payer: Self-pay | Admitting: Internal Medicine

## 2021-01-18 ENCOUNTER — Other Ambulatory Visit (HOSPITAL_COMMUNITY): Payer: Self-pay

## 2021-01-19 ENCOUNTER — Other Ambulatory Visit (HOSPITAL_COMMUNITY): Payer: Self-pay

## 2021-01-19 MED ORDER — METHYLPHENIDATE HCL ER (OSM) 54 MG PO TBCR
54.0000 mg | EXTENDED_RELEASE_TABLET | Freq: Every day | ORAL | 0 refills | Status: AC
  Filled 2021-01-19: qty 30, 30d supply, fill #0

## 2021-01-20 ENCOUNTER — Other Ambulatory Visit (HOSPITAL_COMMUNITY): Payer: Self-pay

## 2021-01-22 ENCOUNTER — Other Ambulatory Visit (HOSPITAL_COMMUNITY): Payer: Self-pay

## 2021-01-28 ENCOUNTER — Other Ambulatory Visit (HOSPITAL_COMMUNITY): Payer: Self-pay

## 2021-01-28 MED ORDER — TRETINOIN 0.05 % EX CREA
TOPICAL_CREAM | CUTANEOUS | 10 refills | Status: AC
  Filled 2021-01-28: qty 20, 30d supply, fill #0
  Filled 2021-04-18: qty 20, 30d supply, fill #1
  Filled 2021-06-02: qty 20, 30d supply, fill #2
  Filled 2021-10-18: qty 20, 30d supply, fill #3
  Filled 2022-01-26: qty 20, 30d supply, fill #4

## 2021-02-04 ENCOUNTER — Other Ambulatory Visit (HOSPITAL_COMMUNITY): Payer: Self-pay

## 2021-02-15 ENCOUNTER — Other Ambulatory Visit (HOSPITAL_COMMUNITY): Payer: Self-pay

## 2021-02-16 ENCOUNTER — Other Ambulatory Visit (HOSPITAL_COMMUNITY): Payer: Self-pay

## 2021-02-16 MED ORDER — METHYLPHENIDATE HCL ER (OSM) 54 MG PO TBCR
EXTENDED_RELEASE_TABLET | ORAL | 0 refills | Status: DC
  Filled 2021-02-16: qty 30, 30d supply, fill #0

## 2021-02-16 MED ORDER — METHYLPHENIDATE HCL ER (OSM) 54 MG PO TBCR
EXTENDED_RELEASE_TABLET | ORAL | 0 refills | Status: AC
  Filled 2021-02-16: qty 30, 30d supply, fill #0

## 2021-03-18 ENCOUNTER — Other Ambulatory Visit (HOSPITAL_COMMUNITY): Payer: Self-pay

## 2021-03-18 MED ORDER — METHYLPHENIDATE HCL ER (OSM) 54 MG PO TBCR
54.0000 mg | EXTENDED_RELEASE_TABLET | Freq: Every day | ORAL | 0 refills | Status: DC
Start: 2021-03-18 — End: 2021-04-20
  Filled 2021-03-18: qty 30, 30d supply, fill #0

## 2021-04-18 ENCOUNTER — Other Ambulatory Visit (HOSPITAL_COMMUNITY): Payer: Self-pay

## 2021-04-20 ENCOUNTER — Other Ambulatory Visit (HOSPITAL_COMMUNITY): Payer: Self-pay

## 2021-04-20 MED ORDER — METHYLPHENIDATE HCL ER (OSM) 27 MG PO TBCR
EXTENDED_RELEASE_TABLET | ORAL | 0 refills | Status: DC
Start: 2021-04-20 — End: 2021-04-20
  Filled 2021-04-20: qty 60, 30d supply, fill #0

## 2021-04-20 MED ORDER — METHYLPHENIDATE HCL ER (OSM) 54 MG PO TBCR
EXTENDED_RELEASE_TABLET | ORAL | 0 refills | Status: AC
  Filled 2021-04-20 – 2021-09-22 (×2): qty 30, 30d supply, fill #0

## 2021-04-20 MED ORDER — CLINDAMYCIN PHOS-BENZOYL PEROX 1-5 % EX GEL
CUTANEOUS | 11 refills | Status: AC
  Filled 2021-04-20: qty 50, 30d supply, fill #0
  Filled 2021-07-21: qty 50, 30d supply, fill #1
  Filled 2021-10-18: qty 50, 30d supply, fill #2
  Filled 2021-12-24: qty 50, 30d supply, fill #3

## 2021-04-21 ENCOUNTER — Other Ambulatory Visit (HOSPITAL_COMMUNITY): Payer: Self-pay

## 2021-04-21 MED ORDER — METHYLPHENIDATE HCL ER (CD) 50 MG PO CPCR
ORAL_CAPSULE | ORAL | 0 refills | Status: AC
Start: 2021-04-20 — End: ?
  Filled 2021-04-21 – 2021-07-22 (×2): qty 30, 30d supply, fill #0

## 2021-04-22 ENCOUNTER — Other Ambulatory Visit (HOSPITAL_COMMUNITY): Payer: Self-pay

## 2021-04-23 ENCOUNTER — Other Ambulatory Visit (HOSPITAL_COMMUNITY): Payer: Self-pay

## 2021-06-03 ENCOUNTER — Other Ambulatory Visit (HOSPITAL_COMMUNITY): Payer: Self-pay

## 2021-07-21 ENCOUNTER — Other Ambulatory Visit (HOSPITAL_COMMUNITY): Payer: Self-pay

## 2021-07-21 MED ORDER — TRETINOIN 0.05 % EX CREA
TOPICAL_CREAM | CUTANEOUS | 2 refills | Status: AC
  Filled 2021-07-21: qty 20, 30d supply, fill #0
  Filled 2021-09-20: qty 20, 30d supply, fill #1
  Filled 2021-12-24: qty 20, 30d supply, fill #2

## 2021-07-22 ENCOUNTER — Other Ambulatory Visit (HOSPITAL_COMMUNITY): Payer: Self-pay

## 2021-07-23 ENCOUNTER — Other Ambulatory Visit (HOSPITAL_COMMUNITY): Payer: Self-pay

## 2021-07-23 MED ORDER — METHYLPHENIDATE HCL ER (OSM) 54 MG PO TBCR
54.0000 mg | EXTENDED_RELEASE_TABLET | Freq: Every day | ORAL | 0 refills | Status: AC
  Filled 2021-07-23: qty 30, 30d supply, fill #0
  Filled 2021-08-17: qty 10, 10d supply, fill #0
  Filled 2021-08-19: qty 20, 20d supply, fill #0

## 2021-08-18 ENCOUNTER — Other Ambulatory Visit (HOSPITAL_COMMUNITY): Payer: Self-pay

## 2021-08-19 ENCOUNTER — Other Ambulatory Visit (HOSPITAL_COMMUNITY): Payer: Self-pay

## 2021-08-26 ENCOUNTER — Other Ambulatory Visit: Payer: Self-pay | Admitting: Internal Medicine

## 2021-08-26 DIAGNOSIS — E042 Nontoxic multinodular goiter: Secondary | ICD-10-CM

## 2021-09-06 ENCOUNTER — Ambulatory Visit
Admission: RE | Admit: 2021-09-06 | Discharge: 2021-09-06 | Disposition: A | Discharge status: Home / Self Care | Payer: Managed Care, Other (non HMO) | Source: Ambulatory Visit | Attending: Internal Medicine | Admitting: Internal Medicine

## 2021-09-06 DIAGNOSIS — E042 Nontoxic multinodular goiter: Secondary | ICD-10-CM

## 2021-09-20 ENCOUNTER — Other Ambulatory Visit (HOSPITAL_COMMUNITY): Payer: Self-pay

## 2021-09-21 ENCOUNTER — Other Ambulatory Visit (HOSPITAL_COMMUNITY): Payer: Self-pay

## 2021-09-22 ENCOUNTER — Other Ambulatory Visit (HOSPITAL_COMMUNITY): Payer: Self-pay

## 2021-10-18 ENCOUNTER — Other Ambulatory Visit (HOSPITAL_COMMUNITY): Payer: Self-pay

## 2021-10-19 ENCOUNTER — Other Ambulatory Visit (HOSPITAL_COMMUNITY): Payer: Self-pay

## 2021-10-20 ENCOUNTER — Other Ambulatory Visit (HOSPITAL_COMMUNITY): Payer: Self-pay

## 2021-10-25 ENCOUNTER — Other Ambulatory Visit (HOSPITAL_COMMUNITY): Payer: Self-pay

## 2021-10-25 MED ORDER — METHYLPHENIDATE HCL ER (OSM) 54 MG PO TBCR
EXTENDED_RELEASE_TABLET | ORAL | 0 refills | Status: AC
  Filled 2021-10-25: qty 30, 30d supply, fill #0

## 2021-10-26 ENCOUNTER — Other Ambulatory Visit (HOSPITAL_COMMUNITY): Payer: Self-pay

## 2021-10-26 MED ORDER — AMOXICILLIN-POT CLAVULANATE 875-125 MG PO TABS
ORAL_TABLET | ORAL | 0 refills | Status: AC
  Filled 2021-10-26: qty 20, 10d supply, fill #0

## 2021-11-23 ENCOUNTER — Other Ambulatory Visit (HOSPITAL_COMMUNITY): Payer: Self-pay

## 2021-11-24 ENCOUNTER — Other Ambulatory Visit (HOSPITAL_COMMUNITY): Payer: Self-pay

## 2021-11-24 MED ORDER — METHYLPHENIDATE HCL ER (OSM) 54 MG PO TBCR
54.0000 mg | EXTENDED_RELEASE_TABLET | Freq: Every day | ORAL | 0 refills | Status: DC
  Filled 2021-11-24: qty 30, 30d supply, fill #0

## 2021-11-25 ENCOUNTER — Other Ambulatory Visit (HOSPITAL_COMMUNITY): Payer: Self-pay

## 2021-12-03 ENCOUNTER — Other Ambulatory Visit (HOSPITAL_COMMUNITY): Payer: Self-pay

## 2021-12-24 ENCOUNTER — Other Ambulatory Visit (HOSPITAL_COMMUNITY): Payer: Self-pay

## 2021-12-24 MED ORDER — METHYLPHENIDATE HCL ER (OSM) 54 MG PO TBCR
54.0000 mg | EXTENDED_RELEASE_TABLET | Freq: Every day | ORAL | 0 refills | Status: DC
  Filled 2021-12-24: qty 30, 30d supply, fill #0

## 2021-12-25 ENCOUNTER — Other Ambulatory Visit (HOSPITAL_COMMUNITY): Payer: Self-pay

## 2022-01-17 ENCOUNTER — Other Ambulatory Visit (HOSPITAL_COMMUNITY): Payer: Self-pay

## 2022-01-17 MED ORDER — METHYLPHENIDATE HCL ER (OSM) 54 MG PO TBCR
54.0000 mg | EXTENDED_RELEASE_TABLET | Freq: Every day | ORAL | 0 refills | Status: AC
  Filled 2022-01-26: qty 30, 30d supply, fill #0

## 2022-01-17 MED ORDER — BETAMETHASONE DIPROPIONATE AUG 0.05 % EX CREA
TOPICAL_CREAM | CUTANEOUS | 0 refills | Status: AC
  Filled 2022-01-17: qty 50, 30d supply, fill #0

## 2022-01-18 ENCOUNTER — Other Ambulatory Visit (HOSPITAL_COMMUNITY): Payer: Self-pay

## 2022-01-26 ENCOUNTER — Other Ambulatory Visit (HOSPITAL_COMMUNITY): Payer: Self-pay

## 2022-01-28 ENCOUNTER — Other Ambulatory Visit (HOSPITAL_COMMUNITY): Payer: Self-pay

## 2022-02-01 ENCOUNTER — Other Ambulatory Visit (HOSPITAL_COMMUNITY): Payer: Self-pay

## 2022-02-03 ENCOUNTER — Other Ambulatory Visit (HOSPITAL_COMMUNITY): Payer: Self-pay

## 2022-03-15 ENCOUNTER — Other Ambulatory Visit (HOSPITAL_COMMUNITY): Payer: Self-pay

## 2022-06-09 IMAGING — XA DG SPINAL PUNCT LUMBAR DIAG WITH FL CT GUIDANCE
1 series · 1 of 1 positions shown · non-contrast
Comparison: MR head without contrast 04/08/2020

CLINICAL DATA: Headaches and visual loss following COVID 19.

EXAM:
DIAGNOSTIC LUMBAR PUNCTURE UNDER FLUOROSCOPIC GUIDANCE

[Series 1: ortho adipose · 1 of 1 slices shown]
[im 1/1]
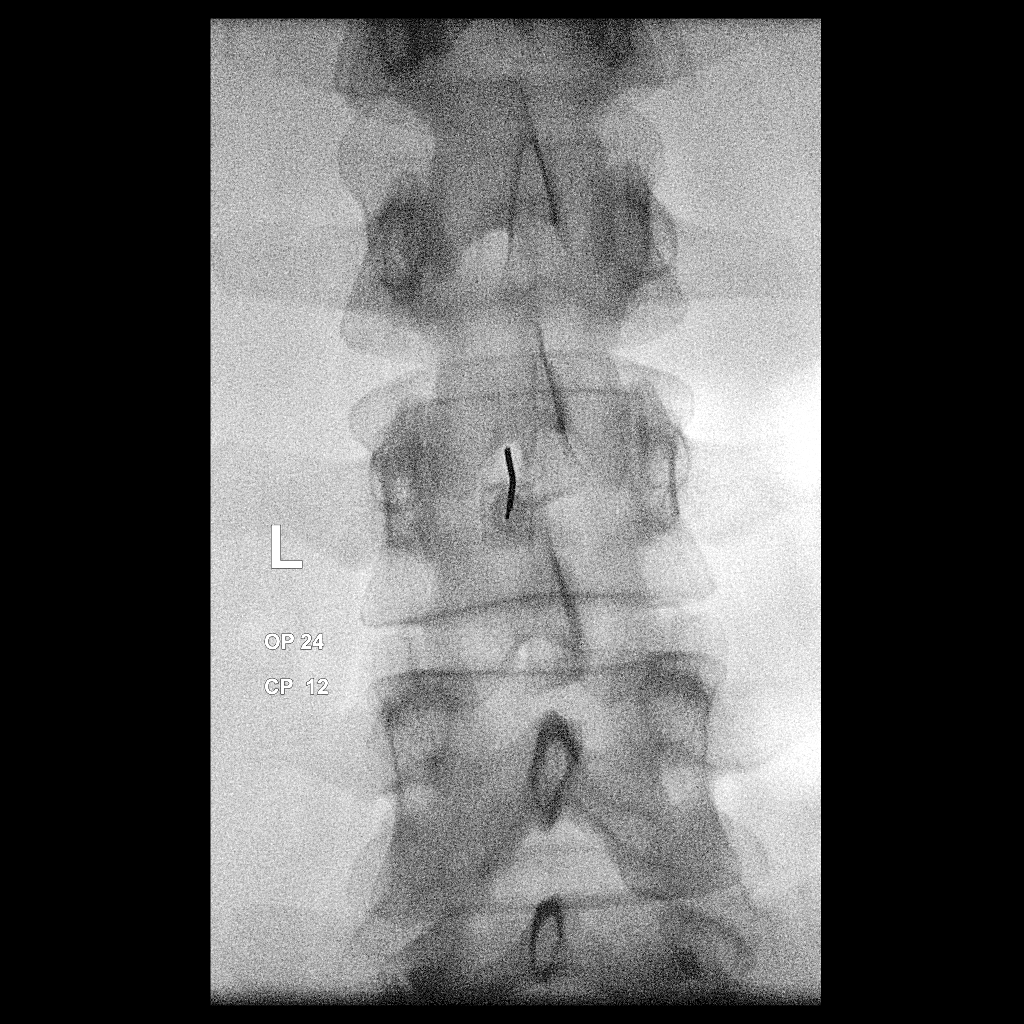

[1 of 1 positions shown; findings below may reference images not displayed]

FLUOROSCOPY TIME:  Fluoroscopy Time: Radiation Exposure Index (as
provided by the fluoroscopic device): 9.29 uGy*m2

PROCEDURE:
Informed consent was obtained from the patient prior to the
procedure, including potential complications of headache, allergy,
and pain. With the patient prone, the lower back was prepped with
Betadine. 1% Lidocaine was used for local anesthesia. Lumbar
puncture was performed at the left paramedian L2-3 level using a 20
gauge needle with return of clear CSF with an opening pressure of 24
cm water. 25.0 ml of CSF were obtained for laboratory studies.
Removal of the CSF, the pressure was normalized to 12.0 cm. The
patient tolerated the procedure well and there were no apparent
complications.
IMPRESSION: 1. Elevated opening pressure of 24 cm water consistent with
idiopathic intracranial hypertension.
2. Pressure was normalized to 12 cm water after removal of 25.0 mL
clear CSF.
3. No fluid was sent for testing. This was confirmed with Dr.
[REDACTED] prior to the procedure.

## 2023-10-12 ENCOUNTER — Ambulatory Visit (INDEPENDENT_AMBULATORY_CARE_PROVIDER_SITE_OTHER): Admitting: Otolaryngology

## 2023-10-12 ENCOUNTER — Ambulatory Visit (INDEPENDENT_AMBULATORY_CARE_PROVIDER_SITE_OTHER): Admitting: Audiology

## 2023-10-12 ENCOUNTER — Encounter (INDEPENDENT_AMBULATORY_CARE_PROVIDER_SITE_OTHER): Payer: Self-pay | Admitting: Otolaryngology

## 2023-10-12 VITALS — BP 125/86 | HR 93

## 2023-10-12 DIAGNOSIS — Z0110 Encounter for examination of ears and hearing without abnormal findings: Secondary | ICD-10-CM

## 2023-10-12 DIAGNOSIS — H6982 Other specified disorders of Eustachian tube, left ear: Secondary | ICD-10-CM | POA: Diagnosis not present

## 2023-10-12 DIAGNOSIS — H93292 Other abnormal auditory perceptions, left ear: Secondary | ICD-10-CM

## 2023-10-12 MED ORDER — FLUTICASONE PROPIONATE 50 MCG/ACT NA SUSP: 2.0000 | Freq: Every day | NASAL | 10 refills | Status: AC

## 2023-10-12 NOTE — Progress Notes (Signed)
  [address] [place], [place] [address] [phone]  Audiological Evaluation    Name: [name]     DOB:   1985/07/02      MRN:   [id number]                                                                                     Service Date: 10/12/2023     Accompanied by: unaccompanied   Patient comes today after Dr. Karis, ENT sent a referral for a hearing evaluation due to concerns with hearing loss.   Symptoms Yes Details  Hearing loss  [x]  Perceives hearing to be worse in the left ear  Tinnitus  []    Ear pain/ infections/pressure  [x]  Both ears pop - left sometimes has like and air sound  Balance problems  []    Noise exposure history  []    Previous ear surgeries  []    Family history of hearing loss  []    Amplification  []    Other  []      Otoscopy: Right ear: Clear external ear canal and notable landmarks visualized on the tympanic membrane. Left ear:  Clear external ear canal and notable landmarks visualized on the tympanic membrane.  Tympanometry: Right ear: Type A- Normal external ear canal volume with normal middle ear pressure and tympanic membrane compliance. Left ear: Type A- Normal external ear canal volume with normal middle ear pressure and tympanic membrane compliance.   Pure tone Audiometry: Both ears- Normal hearing from 250 Hz - 8000 Hz.  Speech Audiometry: Right ear- Speech Reception Threshold (SRT) was obtained at 10 dBHL. Left ear-Speech Reception Threshold (SRT) was obtained at 15 dBHL.   Word Recognition Score Tested using NU-6 (recorded) Right ear: 100% was obtained at a presentation level of 50 dBHL with contralateral masking which is deemed as  excellent. Left ear: 100% was obtained at a presentation level of 55 dBHL with contralateral masking which is deemed as  excellent.   The hearing test results were completed under headphones and results are deemed to be of good reliability. Test technique:  conventional       Recommendations: Follow up with ENT as scheduled for today. Return for a hearing evaluation if concerns with hearing changes arise or per MD recommendation.   [name], AUD

## 2023-10-14 DIAGNOSIS — H6982 Other specified disorders of Eustachian tube, left ear: Secondary | ICD-10-CM | POA: Insufficient documentation

## 2023-10-14 NOTE — Progress Notes (Signed)
 CC: Popping sensation in the left ear  HPI:  [name] is a 38 y.o. female who presents today complaining of frequent popping sensation in her left ear for the past year.  Her symptoms are worse when she swallows.  She also complains of muffled hearing.  She has no recent otitis media or otitis externa.  She has no previous otologic surgery.  She has a history of environmental allergies.  She uses Zyrtec and Claritin as needed.  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   Multinodular goiter    2 nodules bx previously,seen by endo in the past U/S 6/12, 2 right thyroid  nodules are stable, unchanged from prior   PE (pulmonary embolism) 2009   Unknown source, possibly due to contraception,car trip Dr Olegario marry labs all neg    Past Surgical History:  Procedure Laterality Date   COLONOSCOPY  07/03/2020   WISDOM TOOTH EXTRACTION      Family History  Problem Relation Age of Onset   Hypertension Mother    Colon cancer Mother    Deep vein thrombosis Father    Diabetes Father    Hypertension Father    Colon cancer Maternal Grandmother    Ovarian cancer Maternal Grandmother    Diabetes Paternal Grandmother     Social History:  reports that she has never smoked. She has never used smokeless tobacco. She reports current alcohol use. She reports that she does not use drugs.  Allergies: No Known Allergies  Prior to Admission medications   Medication Sig Start Date End Date Taking? Authorizing Provider  fluticasone  (FLONASE ) 50 MCG/ACT nasal spray Place 2 sprays into both nostrils daily. 10/12/23 11/11/23 Yes Karis Clunes, MD  acetaZOLAMIDE  (DIAMOX ) 250 MG tablet Take 1 tablet (250 mg total) by mouth 2 (two) times daily. 06/24/20   Sater, Charlie LABOR, MD  albuterol  (VENTOLIN  HFA) 108 [phone] Base) MCG/ACT inhaler Inhale 2 puffs into the lungs every 6 (six) hours as needed for wheezing or shortness of breath. 12/25/19    Hall-Potvin, [place], PA-C  amoxicillin -clavulanate (AUGMENTIN ) 875-125 MG tablet Take 1 tablet by mouth every 12 hours for 10 days 10/26/21     augmented betamethasone  dipropionate (DIPROLENE -AF) 0.05 % cream Apply on the skin twice a day as needed 01/17/22     Biotin 1000 MCG tablet Take 1,000 mcg by mouth daily.    [provider]  Cholecalciferol (VITAMIN D3) 3000 UNITS TABS Take 1,000 Units by mouth daily.    [provider]  clindamycin -benzoyl peroxide  (BENZACLIN) gel Apply to affected areas of the skin in the morning 07/09/20     clindamycin -benzoyl peroxide  (BENZACLIN) gel Apply small amount to affected area every morning 04/20/21     fluorouracil  (EFUDEX ) 5 % cream Apply thin layer to affected area every other night as directed 09/18/20     fluticasone  (FLONASE ) 50 MCG/ACT nasal spray Place 1 spray into both nostrils as needed for allergies or rhinitis.    [provider]  furosemide  (LASIX ) 20 MG tablet Take 1 tablet (20 mg total) by mouth daily. 07/22/20   Sater, Charlie LABOR, MD  ipratropium (ATROVENT ) 0.06 % nasal spray Place 2 sprays into both nostrils 4 (four) times daily. Patient taking differently: Place 2 sprays into both nostrils as needed. 12/19/17   Gretta Ozell CROME, PA-C  Levonorgestrel  13.5 MG IUD 13.5 mg by Intrauterine route. Replace every 3 years. Skyla  brand  [provider]  methylphenidate  (CONCERTA ) 54 MG PO CR tablet Take 1 tablet by mouth daily 09/15/20     methylphenidate  (METADATE  CD) 50 MG CR capsule Take 1 capsule by mouth daily. 04/20/21     methylphenidate  54 MG PO CR tablet Take 1 tablet (54 mg total) by mouth daily. 07/10/20     methylphenidate  54 MG PO CR tablet Take 1 tablet by mouth once daily 11/19/20     methylphenidate  54 MG PO CR tablet Take1 tablet by mouth daily. 12/21/20   Elouise, Amy, DO  methylphenidate  54 MG PO CR tablet Take 1 tablet by mouth daily. 01/19/21     methylphenidate  54 MG PO CR tablet Take 1 tablet by  mouth daily. 02/16/21     methylphenidate  54 MG PO CR tablet Take 1 tablet by mouth daily. 04/20/21     methylphenidate  54 MG PO CR tablet Take 1 tablet (54 mg total) by mouth daily. 07/23/21     methylphenidate  54 MG PO CR tablet Take 1 tablet by mouth daily. 10/25/21   Elouise, Amy, DO  methylphenidate  54 MG PO CR tablet Take 1 tablet (54 mg total) by mouth daily. 01/17/22   Elouise, Amy, DO  Multiple Vitamins-Calcium (ONE-A-DAY WOMENS FORMULA PO) Take 1 tablet by mouth daily.    [provider]  naproxen sodium (ANAPROX) 220 MG tablet Take 220-440 mg by mouth every 12 (twelve) hours as needed (for pain).    [provider]  omeprazole (PRILOSEC) 20 MG capsule TAKE 1 CAPSULE BY MOUTH 30 MINUTES BEFORE MORNING MEAL Patient taking differently: as needed. 05/07/20 05/07/21  McMichael, Nestor HERO, PA-C  potassium chloride  SA (KLOR-CON ) 20 MEQ tablet Take 1 tablet (20 mEq total) by mouth 2 (two) times daily. 07/22/20   Sater, Charlie LABOR, MD  tretinoin  (RETIN-A ) 0.05 % cream Apply on the skin nightly; Use a few nights a week and increase to nightly as tolerated. 01/28/21     tretinoin  (RETIN-A ) 0.05 % cream Apply to skin and use a few nights a week and increase to nightly as tolerated. 07/21/21       Blood pressure 125/86, pulse 93, SpO2 99%. Exam: General: Communicates without difficulty, well nourished, no acute distress. Head: Normocephalic, no evidence injury, no tenderness, facial buttresses intact without stepoff. Face/sinus: No tenderness to palpation and percussion. Facial movement is normal and symmetric. Eyes: PERRL, EOMI. No scleral icterus, conjunctivae clear. Neuro: CN II exam reveals vision grossly intact.  No nystagmus at any point of gaze. Ears: Auricles well formed without lesions.  Ear canals are intact without mass or lesion.  No erythema or edema is appreciated.  The TMs are intact without fluid. Nose: External evaluation reveals normal support and skin without lesions.   Dorsum is intact.  Anterior rhinoscopy reveals congested mucosa over anterior aspect of inferior turbinates and intact septum.  No purulence noted. Oral:  Oral cavity and oropharynx are intact, symmetric, without erythema or edema.  Mucosa is moist without lesions. Neck: Full range of motion without pain.  There is no significant lymphadenopathy.  No masses palpable.  Thyroid  bed within normal limits to palpation.  Parotid glands and submandibular glands equal bilaterally without mass.  Trachea is midline. Neuro:  CN 2-12 grossly intact.   Her hearing test shows normal hearing bilaterally across all frequencies.  Assessment: 1.  The patient's history and physical exam findings are consistent with left ear eustachian tube dysfunction. 2.  Her ear canals, tympanic membranes, and middle ear spaces are  all normal. 3.  Normal hearing bilaterally across all frequencies.  Plan: 1.  The physical exam findings and the hearing test results are reviewed with the patient. 2.  The patient is reassured that no middle ear effusion or infection is noted. 3.  Flonase  nasal spray 2 sprays each nostril daily. 4.  Valsalva exercise multiple times a day. 5.  The patient will return for reevaluation in 6 weeks.  Pinkie Manger W Reality Dejonge 10/14/2023, 10:30 AM

## 2023-12-05 ENCOUNTER — Ambulatory Visit (INDEPENDENT_AMBULATORY_CARE_PROVIDER_SITE_OTHER): Payer: Self-pay | Admitting: Otolaryngology
